# Patient Record
Sex: Male | Born: 1990 | Race: White | Hispanic: No | Marital: Single | Smoking: Current every day smoker
Health system: Southern US, Community
[De-identification: ages and names within clinical notes are randomized; demographics above are authoritative.]

## PROBLEM LIST (undated history)

## (undated) ENCOUNTER — Emergency Department (HOSPITAL_COMMUNITY): Admission: EM | Payer: Self-pay | Source: Home / Self Care

## (undated) DIAGNOSIS — J45909 Unspecified asthma, uncomplicated: Secondary | ICD-10-CM

## (undated) DIAGNOSIS — F1910 Other psychoactive substance abuse, uncomplicated: Secondary | ICD-10-CM

## (undated) HISTORY — PX: APPENDECTOMY: SHX54

---
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
## 2013-08-25 ENCOUNTER — Encounter (HOSPITAL_COMMUNITY): Payer: Self-pay | Admitting: Emergency Medicine

## 2013-08-25 ENCOUNTER — Emergency Department (HOSPITAL_COMMUNITY): Payer: Self-pay

## 2013-08-25 ENCOUNTER — Emergency Department (HOSPITAL_COMMUNITY)
Admission: EM | Admit: 2013-08-25 | Discharge: 2013-08-25 | Disposition: A | Discharge status: Home / Self Care | Payer: Self-pay | Attending: Emergency Medicine | Admitting: Emergency Medicine

## 2013-08-25 DIAGNOSIS — F17200 Nicotine dependence, unspecified, uncomplicated: Secondary | ICD-10-CM | POA: Insufficient documentation

## 2013-08-25 DIAGNOSIS — X500XXA Overexertion from strenuous movement or load, initial encounter: Secondary | ICD-10-CM | POA: Insufficient documentation

## 2013-08-25 DIAGNOSIS — S93609A Unspecified sprain of unspecified foot, initial encounter: Secondary | ICD-10-CM | POA: Insufficient documentation

## 2013-08-25 DIAGNOSIS — Y9239 Other specified sports and athletic area as the place of occurrence of the external cause: Secondary | ICD-10-CM | POA: Insufficient documentation

## 2013-08-25 DIAGNOSIS — Y9351 Activity, roller skating (inline) and skateboarding: Secondary | ICD-10-CM | POA: Insufficient documentation

## 2013-08-25 DIAGNOSIS — S93601A Unspecified sprain of right foot, initial encounter: Secondary | ICD-10-CM

## 2013-08-25 MED ORDER — HYDROCODONE-ACETAMINOPHEN 5-325 MG PO TABS: ORAL_TABLET | ORAL | Status: DC

## 2013-08-25 MED ORDER — IBUPROFEN 800 MG PO TABS
800.0000 mg | ORAL_TABLET | Freq: Once | ORAL | Status: AC
  Administered 2013-08-25: 800 mg via ORAL
  Filled 2013-08-25: qty 1

## 2013-08-25 MED ORDER — HYDROCODONE-ACETAMINOPHEN 5-325 MG PO TABS
1.0000 | ORAL_TABLET | Freq: Once | ORAL | Status: AC
  Administered 2013-08-25: 1 via ORAL
  Filled 2013-08-25: qty 1

## 2013-08-25 MED ORDER — IBUPROFEN 800 MG PO TABS: 800.0000 mg | ORAL_TABLET | Freq: Three times a day (TID) | ORAL | Status: DC

## 2013-08-25 NOTE — ED Notes (Signed)
Pt injured right foot yesterday while skateboarding.  Came into triage using crutches.

## 2013-08-25 NOTE — ED Provider Notes (Signed)
CSN: [id number]     Arrival date & time 08/25/13  1539 History   First MD Initiated Contact with Patient 08/25/13 1704     Chief Complaint  Patient presents with  . Foot Pain   (Consider location/radiation/quality/duration/timing/severity/associated sxs/prior Treatment) HPI Comments: Taylor Carpenter is a 22 y.o. male who presents to the Emergency Department complaining of right foot pain for one day.  States the pain began after he twisted his foot while skateboarding.  Pain is worse with weight bearing and improves somewhat with rest.  He denies laceration, numbness or weakness.  No hx of previous fx's of the ankle or foot.  He has not tried any therapies prior to arrival   The history is provided by the patient.    History reviewed. No pertinent past medical history. Past Surgical History  Procedure Laterality Date  . Appendectomy     No family history on file. History  Substance Use Topics  . Smoking status: Current Every Day Smoker    Types: Cigarettes  . Smokeless tobacco: Not on file  . Alcohol Use: Yes    Review of Systems  Constitutional: Negative for fever and chills.  Genitourinary: Negative for dysuria and difficulty urinating.  Musculoskeletal: Positive for arthralgias and joint swelling.  Skin: Negative for color change, rash and wound.  Neurological: Negative for weakness and numbness.  All other systems reviewed and are negative.    Allergies  Amoxil  Home Medications  No current outpatient prescriptions on file. BP 126/64  Pulse 93  Temp(Src) 98.1 F (36.7 C) (Oral)  Resp 24  Ht 5\' 8"  (1.727 m)  Wt 130 lb (58.968 kg)  BMI 19.77 kg/m2  SpO2 100% Physical Exam  Nursing note and vitals reviewed. Constitutional: He is oriented to person, place, and time. He appears well-developed and well-nourished. No distress.  HENT:  Head: Normocephalic and atraumatic.  Cardiovascular: Normal rate, regular rhythm, normal heart sounds and intact distal pulses.    No murmur heard. Pulmonary/Chest: Effort normal and breath sounds normal. No respiratory distress.  Musculoskeletal: He exhibits tenderness.  Right lateral ankle is ttp, mild STS is present.  ROM is preserved.  DP pulse is brisk,distal sensation intact.  No erythema, abrasion, bruising or bony deformity.  No proximal tenderness.  Neurological: He is alert and oriented to person, place, and time. He exhibits normal muscle tone. Coordination normal.  Skin: Skin is warm and dry.    ED Course  Procedures (including critical care time) Labs Review Labs Reviewed - No data to display Imaging Review Dg Ankle Complete Right  08/25/2013   CLINICAL DATA:  Pain in the region of the metatarsals after skateboarding injury/trauma yesterday  EXAM: RIGHT ANKLE - COMPLETE 3+ VIEW  COMPARISON:  Foot radiographs obtained and dictated separately  FINDINGS: There is no evidence of fracture, dislocation, or joint effusion. There is no evidence of arthropathy or other focal bone abnormality. Soft tissues are unremarkable. The mortise is symmetric.  IMPRESSION: Negative.   Electronically Signed   By: Christiana Pellant M.D.   On: 08/25/2013 16:27   Dg Foot Complete Right  08/25/2013   CLINICAL DATA:  Pain in the metatarsals after skateboarding  EXAM: RIGHT FOOT COMPLETE - 3+ VIEW  COMPARISON:  Ankle radiographs same date, dictated separately  FINDINGS: There is no evidence of fracture or dislocation. There is no evidence of arthropathy or other focal bone abnormality. Soft tissues are unremarkable.  IMPRESSION: Negative.   Electronically Signed   By: Christiana Pellant  M.D.   On: 08/25/2013 16:28    EKG Interpretation   None       MDM    Patient has own crutches.  Agrees to RICE therapy and orthopedic f/u if needed.  ASO splint applied.  Pain improved.  Remains NV intact.  Patient appears stable for discharge.  Chaylee Ehrsam L. Shandiin Eisenbeis, PA-C 08/29/13 0130

## 2013-08-29 NOTE — ED Provider Notes (Signed)
Medical screening examination/treatment/procedure(s) were performed by non-physician practitioner and as supervising physician I was immediately available for consultation/collaboration.  EKG Interpretation   None         Jerney Baksh L Ashawna Hanback, MD 08/29/13 0925 

## 2017-04-29 ENCOUNTER — Encounter (HOSPITAL_COMMUNITY): Payer: Self-pay

## 2017-04-29 ENCOUNTER — Emergency Department (HOSPITAL_COMMUNITY)
Admission: EM | Admit: 2017-04-29 | Discharge: 2017-04-29 | Disposition: A | Discharge status: Home Health | Payer: Self-pay | Attending: Emergency Medicine | Admitting: Emergency Medicine

## 2017-04-29 DIAGNOSIS — T50901A Poisoning by unspecified drugs, medicaments and biological substances, accidental (unintentional), initial encounter: Secondary | ICD-10-CM | POA: Insufficient documentation

## 2017-04-29 DIAGNOSIS — J45909 Unspecified asthma, uncomplicated: Secondary | ICD-10-CM | POA: Insufficient documentation

## 2017-04-29 DIAGNOSIS — Z79899 Other long term (current) drug therapy: Secondary | ICD-10-CM | POA: Insufficient documentation

## 2017-04-29 DIAGNOSIS — F1910 Other psychoactive substance abuse, uncomplicated: Secondary | ICD-10-CM | POA: Insufficient documentation

## 2017-04-29 DIAGNOSIS — F17290 Nicotine dependence, other tobacco product, uncomplicated: Secondary | ICD-10-CM | POA: Insufficient documentation

## 2017-04-29 HISTORY — DX: Other psychoactive substance abuse, uncomplicated: F19.10

## 2017-04-29 HISTORY — DX: Unspecified asthma, uncomplicated: J45.909

## 2017-04-29 LAB — CBC
HCT: 41.6 % (ref 39.0–52.0)
HEMOGLOBIN: 14.2 g/dL (ref 13.0–17.0)
MCH: 29.8 pg (ref 26.0–34.0)
MCHC: 34.1 g/dL (ref 30.0–36.0)
MCV: 87.4 fL (ref 78.0–100.0)
Platelets: 260 10*3/uL (ref 150–400)
RBC: 4.76 MIL/uL (ref 4.22–5.81)
RDW: 12.7 % (ref 11.5–15.5)
WBC: 13.7 10*3/uL — ABNORMAL HIGH (ref 4.0–10.5)

## 2017-04-29 LAB — BASIC METABOLIC PANEL
ANION GAP: 7 (ref 5–15)
BUN: 10 mg/dL (ref 6–20)
CO2: 25 mmol/L (ref 22–32)
Calcium: 8.8 mg/dL — ABNORMAL LOW (ref 8.9–10.3)
Chloride: 105 mmol/L (ref 101–111)
Creatinine, Ser: 1.02 mg/dL (ref 0.61–1.24)
GFR calc non Af Amer: >60 mL/min (ref >60)
Glucose, Bld: 202 mg/dL — ABNORMAL HIGH (ref 65–99)
POTASSIUM: 3.9 mmol/L (ref 3.5–5.1)
Sodium: 137 mmol/L (ref 135–145)

## 2017-04-29 MED ORDER — SODIUM CHLORIDE 0.9 % IV BOLUS (SEPSIS)
1000.0000 mL | Freq: Once | INTRAVENOUS | Status: AC
  Administered 2017-04-29: 1000 mL via INTRAVENOUS

## 2017-04-29 NOTE — ED Provider Notes (Signed)
[facility]PT Provider Note   CSN: [id number] Arrival date & time: 04/29/17  1426    History   Chief Complaint Chief Complaint  Patient presents with  . Drug Overdose    HPI Taylor PaganiniWilliam J Genco is a 26 y.o. male.  HPI Patient presented to the emergency room after an accidental drug overdose.  Patient states he did use some cocaine earlier in the day however he was snorting heroin this afternoon. patient was found by bystanders , unconscious with sonorous respirations.  EMS was contacted. Patient was given intranasal Narcan.  Shortly thereafter the patient was awake and alert. Patient denies any complaints. He was not trying to harm himself. He was just trying to get high. Patient denies any trouble with chest pain or shortness of breath. No other difficulties. Past Medical History:  Diagnosis Date  . Asthma   . Polysubstance abuse    heroin and opiate addict. Pt reports starting drugs at 14 and IV drugs 26    There are no active problems to display for this patient.   Past Surgical History:  Procedure Laterality Date  . APPENDECTOMY         Home Medications    Prior to Admission medications   Medication Sig Start Date End Date Taking? Authorizing Provider  HYDROcodone-acetaminophen (NORCO/VICODIN) 5-325 MG per tablet Take one-two tabs po q 4-6 hrs prn pain 08/25/13   Triplett, Tammy, PA-C  ibuprofen (ADVIL,MOTRIN) 800 MG tablet Take 1 tablet (800 mg total) by mouth 3 (three) times daily. 08/25/13   Pauline Ausriplett, Tammy, PA-C    Family History No family history on file.  Social History Social History  Substance Use Topics  . Smoking status: Current Every Day Smoker    Packs/day: 1.00    Types: Cigarettes  . Smokeless tobacco: Not on file  . Alcohol use Yes     Allergies   Amoxil [amoxicillin]   Review of Systems Review of Systems  All other systems reviewed and are negative.    Physical Exam Updated Vital Signs BP (!) 121/97   Pulse 99   Temp 98.5  F (36.9 C) (Oral)   Resp (!) 21   SpO2 97%   Physical Exam  Constitutional: He appears well-developed and well-nourished. No distress.  HENT:  Head: Normocephalic and atraumatic.  Right Ear: External ear normal.  Left Ear: External ear normal.  Eyes: Conjunctivae are normal. Right eye exhibits no discharge. Left eye exhibits no discharge. No scleral icterus.  Neck: Neck supple. No tracheal deviation present.  Cardiovascular: Regular rhythm and intact distal pulses.  Tachycardia present.   Pulmonary/Chest: Effort normal and breath sounds normal. No stridor. No respiratory distress. He has no wheezes. He has no rales.  Abdominal: Soft. Bowel sounds are normal. He exhibits no distension. There is no tenderness. There is no rebound and no guarding.  Musculoskeletal: He exhibits no edema or tenderness.  Neurological: He is alert. He has normal strength. No cranial nerve deficit (no facial droop, extraocular movements intact, no slurred speech) or sensory deficit. He exhibits normal muscle tone. He displays no seizure activity. Coordination normal.  Skin: Skin is warm and dry. No rash noted.  Psychiatric: He has a normal mood and affect.  Nursing note and vitals reviewed.    ED Treatments / Results  Labs (all labs ordered are listed, but only abnormal results are displayed) Labs Reviewed  CBC - Abnormal; Notable for the following:       Result Value   WBC 13.7 (*)  All other components within normal limits  BASIC METABOLIC PANEL - Abnormal; Notable for the following:    Glucose, Bld 202 (*)    Calcium 8.8 (*)    All other components within normal limits    EKG  EKG Interpretation  Date/Time:  Saturday April 29 2017 14:26:18 EDT Ventricular Rate:  113 PR Interval:    QRS Duration: 92 QT Interval:  345 QTC Calculation: 473 R Axis:   137 Text Interpretation:  Sinus tachycardia Probable right ventricular hypertrophy Borderline prolonged QT interval No old tracing to  compare Confirmed by Linwood Dibbles [phone]) on 04/29/2017 2:41:12 PM       Radiology No results found.  Procedures Procedures (including critical care time)  Medications Ordered in ED Medications  sodium chloride 0.9 % bolus 1,000 mL (1,000 mLs Intravenous New Bag/Given 04/29/17 1506)     Initial Impression / Assessment and Plan / ED Course  I have reviewed the triage vital signs and the nursing notes.  Pertinent labs & imaging results that were available during my care of the patient were reviewed by me and considered in my medical decision making (see chart for details).  Clinical Course as of Apr 29 1537  Sat Apr 29, 2017  1440 I spoke with the patient, recommending that he tried to get help with his substance abuse problem. We discussed the severity of this overdose and without getting Narcan it's possible he could've died.  Stressed importance of him stopping his drug use.    [JK]  1536 Pt remains alert and awake.  He remains asymptomatic.  He feels ready to go.  [JK]    Clinical Course User Index [JK] Linwood Dibbles, MD   Patient presented to the emergency room after an accidental drug overdose. Patient was monitored in the emergency room. He remained symptom-free.  We discussed the importance of not using any drugs. Out Patient resources were given to help him with his substance abuse problems  Final Clinical Impressions(s) / ED Diagnoses   Final diagnoses:  Accidental drug overdose, initial encounter    New Prescriptions New Prescriptions   No medications on file     Linwood Dibbles, MD 04/29/17 1538

## 2017-04-29 NOTE — ED Notes (Signed)
Authorities in room speaking with pt

## 2017-04-29 NOTE — ED Triage Notes (Addendum)
Pt found by bystander unconscious with snoring resp . EMs called. Administered narcan intranasal and pt came around. Pt reports injecting heroin. Pt is alert and verbally responsive at this time

## 2017-04-29 NOTE — Discharge Instructions (Signed)
Please refer to the discharge instructions for resources in the community to help you with your substance abuse problems as we discussed.  Avoid using drugs in the future.

## 2017-06-03 ENCOUNTER — Emergency Department (HOSPITAL_COMMUNITY)
Admission: EM | Admit: 2017-06-03 | Discharge: 2017-06-04 | Disposition: A | Discharge status: Home / Self Care | Payer: Self-pay | Attending: Emergency Medicine | Admitting: Emergency Medicine

## 2017-06-03 ENCOUNTER — Encounter (HOSPITAL_COMMUNITY): Payer: Self-pay | Admitting: *Deleted

## 2017-06-03 DIAGNOSIS — Z79899 Other long term (current) drug therapy: Secondary | ICD-10-CM | POA: Insufficient documentation

## 2017-06-03 DIAGNOSIS — F1910 Other psychoactive substance abuse, uncomplicated: Secondary | ICD-10-CM | POA: Insufficient documentation

## 2017-06-03 DIAGNOSIS — F17210 Nicotine dependence, cigarettes, uncomplicated: Secondary | ICD-10-CM | POA: Insufficient documentation

## 2017-06-03 DIAGNOSIS — F1110 Opioid abuse, uncomplicated: Secondary | ICD-10-CM | POA: Insufficient documentation

## 2017-06-03 LAB — CBC WITH DIFFERENTIAL/PLATELET
BASOS PCT: 0 %
Basophils Absolute: 0.1 10*3/uL (ref 0.0–0.1)
EOS ABS: 0.3 10*3/uL (ref 0.0–0.7)
Eosinophils Relative: 3 %
HCT: 39.4 % (ref 39.0–52.0)
HEMOGLOBIN: 13.5 g/dL (ref 13.0–17.0)
Lymphocytes Relative: 23 %
Lymphs Abs: 2.5 10*3/uL (ref 0.7–4.0)
MCH: 29.9 pg (ref 26.0–34.0)
MCHC: 34.3 g/dL (ref 30.0–36.0)
MCV: 87.2 fL (ref 78.0–100.0)
MONOS PCT: 10 %
Monocytes Absolute: 1.1 10*3/uL — ABNORMAL HIGH (ref 0.1–1.0)
Neutro Abs: 7.3 10*3/uL (ref 1.7–7.7)
Neutrophils Relative %: 64 %
Platelets: 242 10*3/uL (ref 150–400)
RBC: 4.52 MIL/uL (ref 4.22–5.81)
RDW: 12.9 % (ref 11.5–15.5)
WBC: 11.3 10*3/uL — ABNORMAL HIGH (ref 4.0–10.5)

## 2017-06-03 LAB — URINALYSIS, ROUTINE W REFLEX MICROSCOPIC
BILIRUBIN URINE: NEGATIVE
Glucose, UA: NEGATIVE mg/dL
HGB URINE DIPSTICK: NEGATIVE
Ketones, ur: NEGATIVE mg/dL
Leukocytes, UA: NEGATIVE
NITRITE: NEGATIVE
PROTEIN: NEGATIVE mg/dL
SPECIFIC GRAVITY, URINE: 1.01 (ref 1.005–1.030)
pH: 5 (ref 5.0–8.0)

## 2017-06-03 LAB — COMPREHENSIVE METABOLIC PANEL
ALBUMIN: 3.8 g/dL (ref 3.5–5.0)
ALT: 13 U/L — AB (ref 17–63)
AST: 19 U/L (ref 15–41)
Alkaline Phosphatase: 89 U/L (ref 38–126)
Anion gap: 10 (ref 5–15)
BUN: 6 mg/dL (ref 6–20)
CHLORIDE: 101 mmol/L (ref 101–111)
CO2: 27 mmol/L (ref 22–32)
Calcium: 8.9 mg/dL (ref 8.9–10.3)
Creatinine, Ser: 0.9 mg/dL (ref 0.61–1.24)
GFR calc Af Amer: >60 mL/min (ref >60)
GFR calc non Af Amer: >60 mL/min (ref >60)
GLUCOSE: 73 mg/dL (ref 65–99)
POTASSIUM: 3.3 mmol/L — AB (ref 3.5–5.1)
Sodium: 138 mmol/L (ref 135–145)
Total Bilirubin: 0.4 mg/dL (ref 0.3–1.2)
Total Protein: 7 g/dL (ref 6.5–8.1)

## 2017-06-03 LAB — RAPID URINE DRUG SCREEN, HOSP PERFORMED
AMPHETAMINES: NOT DETECTED
BENZODIAZEPINES: POSITIVE — AB
Barbiturates: NOT DETECTED
Cocaine: POSITIVE — AB
Opiates: POSITIVE — AB
TETRAHYDROCANNABINOL: POSITIVE — AB

## 2017-06-03 LAB — ETHANOL: Alcohol, Ethyl (B): <10 mg/dL (ref <10)

## 2017-06-03 LAB — ACETAMINOPHEN LEVEL

## 2017-06-03 LAB — SALICYLATE LEVEL: Salicylate Lvl: <7 mg/dL (ref 2.8–30.0)

## 2017-06-03 NOTE — ED Notes (Signed)
Front desk called asking if the pt mother and sister could come back. Pt stated that he did not want to see them or have any visitors.  Pt  Mother called and asked to speak to the police officer after further talking to the mother she was tring to get information regarding the pt, advised the mother that the officer was only sitting/watching and could not give any information.

## 2017-06-03 NOTE — ED Notes (Signed)
Pt changed into scrubs, pts belongings locked in locker, pt states he does not need to urinate at this time, aware of DO.

## 2017-06-03 NOTE — ED Triage Notes (Signed)
Pt brought in by [employer] with IVC papers that were taken out by his mother. Pt and mother report a heroin overdose 9-2. Pt reports he got "bad" heroin. Mother reports finding syringes in the home and is scared the pt is going to overdose again and is not following through with his treatment with [facility]. Pt reports abusing marijuana, cocaine, roxies and  Percocet. Pt denies use of heroin since his overdose. Pt denies si/hi and feels hopeless.

## 2017-06-03 NOTE — ED Provider Notes (Signed)
[facility] Provider Note   CSN: [id number] Arrival date & time: 06/03/17  1850     History   Chief Complaint Chief Complaint  Patient presents with  . V70.1    HPI Taylor Carpenter is a 26 y.o. male.  The patient was committed by his mother for substance abuse. He does have a history of an accidental overdose with heroin about 1 month ago.he has not been getting patient states that he is now beginning an outpatient help.   The history is provided by the patient.  Altered Mental Status   This is a recurrent problem. The current episode started more than 1 week ago. The problem has not changed since onset.Associated symptoms include agitation. Pertinent negatives include no seizures and no hallucinations. Risk factors include illicit drug use. His past medical history does not include seizures.    Past Medical History:  Diagnosis Date  . Asthma   . 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 Pt reports starting drugs at 14 and IV drugs 26    There are no active problems to display for this patient.   Past Surgical History:  Procedure Laterality Date  . APPENDECTOMY         Home Medications    Prior to Admission medications   Medication Sig Start Date End Date Taking? Authorizing Provider  HYDROcodone-acetaminophen (NORCO/VICODIN) 5-325 MG per tablet Take one-two tabs po q 4-6 hrs prn pain 08/25/13   Triplett, Tammy, PA-C  ibuprofen (ADVIL,MOTRIN) 800 MG tablet Take 1 tablet (800 mg total) by mouth 3 (three) times daily. 08/25/13   Pauline Aus, PA-C    Family History History reviewed. No pertinent family history.  Social History Social History  Substance Use Topics  . Smoking status: Current Every Day Smoker    Packs/day: 1.00    Types: Cigarettes  . Smokeless tobacco: Never Used  . Alcohol use Yes     Allergies   Amoxil [amoxicillin]   Review of Systems Review of Systems  Constitutional: Negative for appetite change and  fatigue.  HENT: Negative for congestion, ear discharge and sinus pressure.   Eyes: Negative for discharge.  Respiratory: Negative for cough.   Cardiovascular: Negative for chest pain.  Gastrointestinal: Negative for abdominal pain and diarrhea.  Genitourinary: Negative for frequency and hematuria.  Musculoskeletal: Negative for back pain.  Skin: Negative for rash.  Neurological: Negative for seizures and headaches.  Psychiatric/Behavioral: Positive for agitation. Negative for hallucinations.     Physical Exam Updated Vital Signs BP (!) 146/97 (BP Location: Right Arm)   Pulse 86   Temp 98.4 F (36.9 C) (Oral)   Resp 18   Ht  (1.727 m)   Wt 54.4 kg (120 lb)   SpO2 100%   BMI 18.25 kg/m   Physical Exam  Constitutional: He is oriented to person, place, and time. He appears well-developed.  HENT:  Head: Normocephalic.  Eyes: Conjunctivae and EOM are normal. No scleral icterus.  Neck: Neck supple. No thyromegaly present.  Cardiovascular: Normal rate and regular rhythm.  Exam reveals no gallop and no friction rub.   No murmur heard. Pulmonary/Chest: No stridor. He has no wheezes. He has no rales. He exhibits no tenderness.  Abdominal: He exhibits no distension. There is no tenderness. There is no rebound.  Musculoskeletal: Normal range of motion. He exhibits no edema.  Lymphadenopathy:    He has no cervical adenopathy.  Neurological: He is oriented to person, place, and time. He exhibits  normal muscle tone. Coordination normal.  Skin: No rash noted. No erythema.  Psychiatric:  Patient has some depression. but is not suicidal or homicidal     ED Treatments / Results  Labs (all labs ordered are listed, but only abnormal results are displayed) Labs Reviewed  COMPREHENSIVE METABOLIC PANEL - Abnormal; Notable for the following:       Result Value   Potassium 3.3 (*)    ALT 13 (*)    All other components within normal limits  RAPID URINE DRUG SCREEN, HOSP PERFORMED -  Abnormal; Notable for the following:    Opiates POSITIVE (*)    Cocaine POSITIVE (*)    Benzodiazepines POSITIVE (*)    Tetrahydrocannabinol POSITIVE (*)    All other components within normal limits  CBC WITH DIFFERENTIAL/PLATELET - Abnormal; Notable for the following:    WBC 11.3 (*)    Monocytes Absolute 1.1 (*)    All other components within normal limits  ACETAMINOPHEN LEVEL - Abnormal; Notable for the following:    Acetaminophen (Tylenol), Serum <10 (*)    All other components within normal limits  ETHANOL  URINALYSIS, ROUTINE W REFLEX MICROSCOPIC  SALICYLATE LEVEL    EKG  EKG Interpretation None       Radiology No results found.  Procedures Procedures (including critical care time)  Medications Ordered in ED Medications - No data to display   Initial Impression / Assessment and Plan / ED Course  I have reviewed the triage vital signs and the nursing notes.  Pertinent labs & imaging results that were available during my care of the patient were reviewed by me and considered in my medical decision making (see chart for details).     Patient was substance abuse. He will be evaluated by behavioral health  Final Clinical Impressions(s) / ED Diagnoses   Final diagnoses:  Substance abuse [facility])    New Prescriptions New Prescriptions   No medications on file     Bethann Berkshire, MD 06/03/17 2222
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
## 2017-06-04 NOTE — Discharge Instructions (Signed)
Follow up with [facility] as discussed tonight with the mental health worker. Return to the ED if you think you are going to purposely harm yourself or someone else.

## 2017-06-04 NOTE — ED Provider Notes (Signed)
Ford, TTS, called at 12:30 AM. He states he has done a tele-psych evaluation the patient and does not feel patient meets criteria for inpatient admission. He notes that Dr. Estell Harpin has already rescinded the IVC. He states the PA is going to evaluate the patient.  12:50 AM 4, TTS called back stating the PA has evaluated the patient. He agrees the patient can be discharged home to [facility].  1:10 AM patient is watching TV in his room in no distress. I talked to him and he is agreeable to being discharged and follow-up that [facility]. He states he's never gone to a regular program before. Patient does not have SI or HI.  Diagnoses that have been ruled out:  None  Diagnoses that are still under consideration:  None  Final diagnoses:  Substance abuse (HCC)  Heroin abuse [facility])    Plan discharge  Devoria Albe, MD, Concha Pyo, MD 06/04/17 [phone]

## 2017-06-04 NOTE — Progress Notes (Signed)
Patient was IVCd by family due to substance abuse. IVC was rescinded by Dr. Estell Harpin prior to evaluation by TTS or this Clinical research associate. Patient examined via telepsych. On exam he is alert and oriented x 4, pleasant, and cooperative. Appears well groomed.  Mood appears euthymic. Affect appropriate and congruent with mood. Denies suicidal thoughts, homicidal thoughts, audiovisual hallucinations. No indication that he is responding to internal stimuli. He does report polysubstance abuse. Uses opiates, xanax, marijuana, and cocaine. Patient does not meet inpatient criteria. TTS to provide with resources for substance abuse treatment. Case discussed with Dr. Lucianne Muss.

## 2017-06-04 NOTE — ED Notes (Signed)
Pt in room getting dressed no signs of aggression.[facility]

## 2017-09-07 ENCOUNTER — Emergency Department (HOSPITAL_COMMUNITY): Payer: No Typology Code available for payment source

## 2017-09-07 ENCOUNTER — Encounter (HOSPITAL_COMMUNITY): Payer: Self-pay | Admitting: Adult Health

## 2017-09-07 ENCOUNTER — Other Ambulatory Visit: Payer: Self-pay

## 2017-09-07 ENCOUNTER — Emergency Department (HOSPITAL_COMMUNITY)
Admission: EM | Admit: 2017-09-07 | Discharge: 2017-09-07 | Disposition: A | Discharge status: Home / Self Care | Payer: No Typology Code available for payment source | Attending: Emergency Medicine | Admitting: Emergency Medicine

## 2017-09-07 DIAGNOSIS — S50811A Abrasion of right forearm, initial encounter: Secondary | ICD-10-CM | POA: Diagnosis not present

## 2017-09-07 DIAGNOSIS — Y939 Activity, unspecified: Secondary | ICD-10-CM | POA: Diagnosis not present

## 2017-09-07 DIAGNOSIS — J45909 Unspecified asthma, uncomplicated: Secondary | ICD-10-CM | POA: Diagnosis not present

## 2017-09-07 DIAGNOSIS — Z23 Encounter for immunization: Secondary | ICD-10-CM | POA: Diagnosis not present

## 2017-09-07 DIAGNOSIS — S0990XA Unspecified injury of head, initial encounter: Secondary | ICD-10-CM | POA: Insufficient documentation

## 2017-09-07 DIAGNOSIS — Y929 Unspecified place or not applicable: Secondary | ICD-10-CM | POA: Diagnosis not present

## 2017-09-07 DIAGNOSIS — Z880 Allergy status to penicillin: Secondary | ICD-10-CM | POA: Insufficient documentation

## 2017-09-07 DIAGNOSIS — F17210 Nicotine dependence, cigarettes, uncomplicated: Secondary | ICD-10-CM | POA: Diagnosis not present

## 2017-09-07 DIAGNOSIS — Y999 Unspecified external cause status: Secondary | ICD-10-CM | POA: Insufficient documentation

## 2017-09-07 MED ORDER — IBUPROFEN 600 MG PO TABS: 600.0000 mg | ORAL_TABLET | Freq: Four times a day (QID) | ORAL | 0 refills | Status: DC | PRN

## 2017-09-07 MED ORDER — TETANUS-DIPHTH-ACELL PERTUSSIS 5-2.5-18.5 LF-MCG/0.5 IM SUSP
0.5000 mL | Freq: Once | INTRAMUSCULAR | Status: AC
  Administered 2017-09-07: 0.5 mL via INTRAMUSCULAR

## 2017-09-07 MED ORDER — TETANUS-DIPHTH-ACELL PERTUSSIS 5-2.5-18.5 LF-MCG/0.5 IM SUSP
INTRAMUSCULAR | Status: AC
  Administered 2017-09-07: 23:00:00 0.5 mL via INTRAMUSCULAR
  Filled 2017-09-07: qty 0.5

## 2017-09-07 MED ORDER — ACETAMINOPHEN 325 MG PO TABS
650.0000 mg | ORAL_TABLET | Freq: Once | ORAL | Status: AC
  Administered 2017-09-07: 650 mg via ORAL
  Filled 2017-09-07: qty 2

## 2017-09-07 MED ORDER — BACITRACIN-NEOMYCIN-POLYMYXIN 400-5-5000 EX OINT
TOPICAL_OINTMENT | Freq: Once | CUTANEOUS | Status: AC
  Administered 2017-09-07: 1 via TOPICAL
  Filled 2017-09-07: qty 1
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
## 2018-04-14 ENCOUNTER — Other Ambulatory Visit: Payer: Self-pay

## 2018-04-14 ENCOUNTER — Emergency Department (HOSPITAL_COMMUNITY)
Admission: EM | Admit: 2018-04-14 | Discharge: 2018-04-14 | Disposition: A | Discharge status: Home / Self Care | Payer: Self-pay | Attending: Emergency Medicine | Admitting: Emergency Medicine

## 2018-04-14 ENCOUNTER — Encounter (HOSPITAL_COMMUNITY): Payer: Self-pay

## 2018-04-14 ENCOUNTER — Emergency Department (HOSPITAL_COMMUNITY): Payer: Self-pay

## 2018-04-14 DIAGNOSIS — L03113 Cellulitis of right upper limb: Secondary | ICD-10-CM | POA: Insufficient documentation

## 2018-04-14 DIAGNOSIS — F17210 Nicotine dependence, cigarettes, uncomplicated: Secondary | ICD-10-CM | POA: Insufficient documentation

## 2018-04-14 DIAGNOSIS — L02419 Cutaneous abscess of limb, unspecified: Secondary | ICD-10-CM

## 2018-04-14 DIAGNOSIS — L02413 Cutaneous abscess of right upper limb: Secondary | ICD-10-CM | POA: Insufficient documentation

## 2018-04-14 DIAGNOSIS — L03119 Cellulitis of unspecified part of limb: Secondary | ICD-10-CM

## 2018-04-14 MED ORDER — POVIDONE-IODINE 10 % EX SOLN
CUTANEOUS | Status: AC
  Administered 2018-04-14: 3
  Filled 2018-04-14: qty 45

## 2018-04-14 MED ORDER — POVIDONE-IODINE 10 % OINT PACKET: TOPICAL_OINTMENT | Freq: Once | CUTANEOUS | Status: DC

## 2018-04-14 MED ORDER — CLINDAMYCIN HCL 150 MG PO CAPS
300.0000 mg | ORAL_CAPSULE | Freq: Once | ORAL | Status: AC
  Administered 2018-04-14: 300 mg via ORAL
  Filled 2018-04-14: qty 2

## 2018-04-14 MED ORDER — LIDOCAINE-EPINEPHRINE (PF) 2 %-1:200000 IJ SOLN
20.0000 mL | Freq: Once | INTRAMUSCULAR | Status: AC
  Administered 2018-04-14: 20 mL

## 2018-04-14 MED ORDER — CLINDAMYCIN HCL 300 MG PO CAPS: 300.0000 mg | ORAL_CAPSULE | Freq: Three times a day (TID) | ORAL | 0 refills | Status: DC

## 2018-04-14 MED ORDER — POVIDONE-IODINE 10 % EX OINT
TOPICAL_OINTMENT | Freq: Once | CUTANEOUS | Status: AC
  Administered 2018-04-14: 3 via TOPICAL
  Filled 2018-04-14: qty 28.35

## 2018-04-14 MED ORDER — BACITRACIN ZINC 500 UNIT/GM EX OINT: 1.0000 "application " | TOPICAL_OINTMENT | Freq: Two times a day (BID) | CUTANEOUS | 0 refills | Status: DC

## 2018-04-14 MED ORDER — LIDOCAINE-EPINEPHRINE (PF) 2 %-1:200000 IJ SOLN
20.0000 mL | Freq: Once | INTRAMUSCULAR | Status: DC
  Filled 2018-04-14: qty 20

## 2018-04-14 NOTE — ED Notes (Signed)
Pt prefers to be referred to by "Joey"

## 2018-04-14 NOTE — ED Provider Notes (Signed)
[facility] EMERGENCY DEPARTMENT Provider Note   CSN: [id number] Arrival date & time: 04/14/18  0228     History   Chief Complaint Chief Complaint  Patient presents with  . Wound Check    HPI Taylor Carpenter is a 27 y.o. male.  Patient presenting with multiple wounds to his left forearm and face.  States this is from picking.  He admits to abusing methamphetamine and injecting about 1 week ago.  He states he has had wounds from picking and itching and not from injection use.  Denies fever, chills, nausea or vomiting.  Denies focal weakness, numbness or tingling.  He is been trying to open some of the wounds on his own and some of them have been draining liquid and blood and pus. He is up-to-date on his tetanus shot.  The history is provided by the patient.  Wound Check  Pertinent negatives include no chest pain, no abdominal pain, no headaches and no shortness of breath.    Past Medical History:  Diagnosis Date  . Asthma   . 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 Pt reports starting drugs at 14 and IV drugs 26    There are no active problems to display for this patient.   Past Surgical History:  Procedure Laterality Date  . APPENDECTOMY          Home Medications    Prior to Admission medications   Medication Sig Start Date End Date Taking? Authorizing Provider  HYDROcodone-acetaminophen (NORCO/VICODIN) 5-325 MG per tablet Take one-two tabs po q 4-6 hrs prn pain 08/25/13   Triplett, Tammy, PA-C  ibuprofen (ADVIL,MOTRIN) 600 MG tablet Take 1 tablet (600 mg total) by mouth every 6 (six) hours as needed for headache or moderate pain. 09/07/17   Burgess Amor, PA-C    Family History No family history on file.  Social History Social History   Tobacco Use  . Smoking status: Current Every Day Smoker    Packs/day: 1.00    Types: Cigarettes  . Smokeless tobacco: Never Used  Substance Use Topics  . Alcohol use: Yes  . Drug use: Yes    Types:  Marijuana, Cocaine, IV    Comment: heroine addict -denies any since 9-2      Allergies   Amoxil [amoxicillin]   Review of Systems Review of Systems  Constitutional: Negative for activity change, appetite change and fever.  HENT: Negative for congestion and rhinorrhea.   Eyes: Negative for visual disturbance.  Respiratory: Negative for cough, chest tightness and shortness of breath.   Cardiovascular: Negative for chest pain.  Gastrointestinal: Negative for abdominal pain, nausea and vomiting.  Genitourinary: Negative for dysuria, hematuria and testicular pain.  Musculoskeletal: Negative for arthralgias and myalgias.  Skin: Positive for wound.  Neurological: Negative for dizziness, weakness and headaches.   all other systems are negative except as noted in the HPI and PMH.     Physical Exam Updated Vital Signs BP 122/87 (BP Location: Right Arm)   Pulse (!) 106   Temp 98 F (36.7 C) (Oral)   Resp 16   Ht 5\' 8"  (1.727 m)   Wt 58.1 kg   SpO2 97%   BMI 19.46 kg/m   Physical Exam  Constitutional: He is oriented to person, place, and time. He appears well-developed and well-nourished. No distress.  HENT:  Head: Normocephalic and atraumatic.  Mouth/Throat: Oropharynx is clear and moist. No oropharyngeal exudate.  Eyes: Pupils are equal, round, and reactive to light.  Conjunctivae and EOM are normal.  Neck: Normal range of motion. Neck supple.  No meningismus.  Cardiovascular: Normal rate, regular rhythm, normal heart sounds and intact distal pulses.  No murmur heard. Pulmonary/Chest: Effort normal and breath sounds normal. No respiratory distress.  Abdominal: Soft. There is no tenderness. There is no rebound and no guarding.  Musculoskeletal: Normal range of motion. He exhibits edema and tenderness.   Left dorsal forearm with extensive soft tissue infection.  Multiple areas of firm and indurated nodules. There are at least 3 separate discrete abscesses with fluctuance.   There is a draining lesion to the center of the forearm with surrounding areas of fluctuance. See photographs.  Intact radial pulse, cardinal hand movements intact.  Full range of motion of elbow and wrist.  Neurological: He is alert and oriented to person, place, and time. No cranial nerve deficit. He exhibits normal muscle tone. Coordination normal.  No ataxia on finger to nose bilaterally. No pronator drift. 5/5 strength throughout. CN 2-12 intact.Equal grip strength. Sensation intact.   Skin: Skin is warm.  Psychiatric: He has a normal mood and affect. His behavior is normal.  Nursing note and vitals reviewed.          ED Treatments / Results  Labs (all labs ordered are listed, but only abnormal results are displayed) Labs Reviewed - No data to display  EKG None  Radiology Dg Forearm Left  Result Date: 04/14/2018 CLINICAL DATA:  Multiple wounds in varying stages of healing in the arms and face. Large left arm wound. Question of foreign body. EXAM: LEFT FOREARM - 2 VIEW COMPARISON:  None. FINDINGS: Soft tissue defect over the dorsal aspect of the mid left forearm. No radiopaque soft tissue foreign bodies identified. Left radius and ulna appear intact. No acute fracture or dislocation. No focal bone lesion or bone destruction. No evidence of osteomyelitis. IMPRESSION: Soft tissue defect over the dorsal aspect of the mid left forearm. No radiopaque foreign bodies. No acute bony abnormalities. Electronically Signed   By: Burman NievesWilliam  Stevens M.D.   On: 04/14/2018 05:17    Procedures .Marland Kitchen.Incision and Drainage Date/Time: 04/14/2018 4:55 AM Performed by: Glynn Octaveancour, Julis Haubner, MD Authorized by: Glynn Octaveancour, Yida Hyams, MD   Consent:    Consent obtained:  Verbal   Consent given by:  Patient   Risks discussed:  Pain, incomplete drainage, bleeding, damage to other organs and infection Location:    Type:  Abscess   Size:  2   Location:  Upper extremity   Upper extremity location:  Arm   Arm  location:  L lower arm Pre-procedure details:    Skin preparation:  Betadine Anesthesia (see MAR for exact dosages):    Anesthesia method:  Local infiltration   Local anesthetic:  Lidocaine 2% WITH epi Procedure type:    Complexity:  Complex Procedure details:    Needle aspiration: no     Incision types:  Single straight   Incision depth:  Subcutaneous   Scalpel blade:  11   Wound management:  Probed and deloculated, irrigated with saline and extensive cleaning   Drainage:  Purulent   Drainage amount:  Moderate   Wound treatment:  Wound left open   Packing materials:  None Post-procedure details:    Patient tolerance of procedure:  Tolerated well, no immediate complications .Marland Kitchen.Incision and Drainage Date/Time: 04/14/2018 4:56 AM Performed by: Glynn Octaveancour, Kimmie Doren, MD Authorized by: Glynn Octaveancour, Maleiya Pergola, MD   Consent:    Consent obtained:  Verbal   Consent given by:  Patient  Risks discussed:  Bleeding, incomplete drainage, pain and damage to other organs Location:    Type:  Abscess   Size:  2   Location:  Upper extremity   Upper extremity location:  Arm   Arm location:  L lower arm Pre-procedure details:    Skin preparation:  Betadine Anesthesia (see MAR for exact dosages):    Anesthesia method:  Local infiltration   Local anesthetic:  Lidocaine 2% WITH epi Procedure type:    Complexity:  Complex Procedure details:    Incision types:  Single straight   Incision depth:  Subcutaneous   Scalpel blade:  11   Wound management:  Probed and deloculated, irrigated with saline and extensive cleaning   Drainage:  Purulent   Drainage amount:  Moderate   Packing materials:  None Post-procedure details:    Patient tolerance of procedure:  Tolerated well, no immediate complications .Marland Kitchen.Incision and Drainage Date/Time: 04/14/2018 4:57 AM Performed by: Glynn Octaveancour, Janaiya Beauchesne, MD Authorized by: Glynn Octaveancour, Renise Gillies, MD   Consent:    Consent obtained:  Verbal   Consent given by:  Patient   Risks  discussed:  Bleeding, incomplete drainage, pain, infection and damage to other organs Location:    Type:  Abscess   Size:  3   Location:  Upper extremity   Upper extremity location:  Arm   Arm location:  L lower arm Pre-procedure details:    Skin preparation:  Betadine Anesthesia (see MAR for exact dosages):    Anesthesia method:  Local infiltration   Local anesthetic:  Lidocaine 2% WITH epi Procedure type:    Complexity:  Complex Procedure details:    Incision types:  Single straight   Incision depth:  Subcutaneous   Scalpel blade:  11   Wound management:  Probed and deloculated, irrigated with saline and extensive cleaning   Drainage:  Purulent   Drainage amount:  Copious   Wound treatment:  Wound left open   Packing materials:  None Post-procedure details:    Patient tolerance of procedure:  Tolerated well, no immediate complications   (including critical care time)  Medications Ordered in ED Medications  lidocaine-EPINEPHrine (XYLOCAINE W/EPI) 2 %-1:200000 (PF) injection 20 mL (20 mLs Intradermal Not Given 04/14/18 0338)  lidocaine-EPINEPHrine (XYLOCAINE W/EPI) 2 %-1:200000 (PF) injection 20 mL (20 mLs Infiltration Given 04/14/18 0337)  povidone-iodine (BETADINE) 10 % ointment (3 application Topical Given 04/14/18 0339)  povidone-iodine (BETADINE) 10 % external solution (3 application  Given 04/14/18 0340)     Initial Impression / Assessment and Plan / ED Course  I have reviewed the triage vital signs and the nursing notes.  Pertinent labs & imaging results that were available during my care of the patient were reviewed by me and considered in my medical decision making (see chart for details).    Patient with soft tissue infection and abscesses to left forearm after IV drug abuse.  Denies fever.  He is neurovascularly intact.  Tetanus is up-to-date.  Nontoxic-appearing  X-ray will be obtained.  Incision and drainage performed of 3 of the largest lesions with large  amount of pus expressed. Discussed with patient that other small lesions are not amenable to I&D at this time.  Wound extensively irrigated and cleaned.  He will be started on antibiotics.  Recommend topical and as well as p.o. antibiotics.  Wound check in 2 days. Cease IV drug use.  Return precautions discussed.  Final Clinical Impressions(s) / ED Diagnoses   Final diagnoses:  Cellulitis and abscess of upper extremity  ED Discharge Orders    None       Dala Breault, Jeannett Senior, MD 04/14/18 [phone]

## 2018-04-14 NOTE — ED Notes (Signed)
Patient transported to X-ray 

## 2018-04-14 NOTE — ED Triage Notes (Signed)
Pt has multiple wounds in varying stages of healing to his arms and face.  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  Pt particularly wants the one on his left forearm looked at.

## 2018-04-14 NOTE — Discharge Instructions (Addendum)
Wash the wounds twice daily with soap and water.  Take the antibiotics as prescribed.  Follow-up for a wound check in 2 days in this department.  Stop using IV drugs.  Return to the ED if you develop worsening pain, fever, vomiting or other concerns.

## 2018-04-14 NOTE — ED Notes (Signed)
ED Provider at bedside. 

## 2019-04-02 ENCOUNTER — Encounter (HOSPITAL_COMMUNITY): Payer: Self-pay

## 2019-04-02 ENCOUNTER — Emergency Department (HOSPITAL_COMMUNITY)
Admission: EM | Admit: 2019-04-02 | Discharge: 2019-04-02 | Disposition: A | Discharge status: Home / Self Care | Payer: Self-pay | Attending: Emergency Medicine | Admitting: Emergency Medicine

## 2019-04-02 ENCOUNTER — Other Ambulatory Visit: Payer: Self-pay

## 2019-04-02 DIAGNOSIS — S76911A Strain of unspecified muscles, fascia and tendons at thigh level, right thigh, initial encounter: Secondary | ICD-10-CM | POA: Insufficient documentation

## 2019-04-02 DIAGNOSIS — Y939 Activity, unspecified: Secondary | ICD-10-CM | POA: Insufficient documentation

## 2019-04-02 DIAGNOSIS — X58XXXA Exposure to other specified factors, initial encounter: Secondary | ICD-10-CM | POA: Insufficient documentation

## 2019-04-02 DIAGNOSIS — Y999 Unspecified external cause status: Secondary | ICD-10-CM | POA: Insufficient documentation

## 2019-04-02 DIAGNOSIS — Z79899 Other long term (current) drug therapy: Secondary | ICD-10-CM | POA: Insufficient documentation

## 2019-04-02 DIAGNOSIS — Y929 Unspecified place or not applicable: Secondary | ICD-10-CM | POA: Insufficient documentation

## 2019-04-02 DIAGNOSIS — F17210 Nicotine dependence, cigarettes, uncomplicated: Secondary | ICD-10-CM | POA: Insufficient documentation

## 2019-04-02 MED ORDER — CYCLOBENZAPRINE HCL 10 MG PO TABS
10.0000 mg | ORAL_TABLET | Freq: Once | ORAL | Status: AC
Start: 2019-04-02 — End: 2019-04-02
  Administered 2019-04-02: 10 mg via ORAL
  Filled 2019-04-02: qty 1

## 2019-04-02 MED ORDER — CYCLOBENZAPRINE HCL 10 MG PO TABS: 10.0000 mg | ORAL_TABLET | Freq: Three times a day (TID) | ORAL | 0 refills | Status: DC

## 2019-04-02 MED ORDER — ONDANSETRON HCL 4 MG PO TABS
4.0000 mg | ORAL_TABLET | Freq: Once | ORAL | Status: AC
  Administered 2019-04-02: 10:00:00 4 mg via ORAL
  Filled 2019-04-02: qty 1

## 2019-04-02 MED ORDER — KETOROLAC TROMETHAMINE 10 MG PO TABS
10.0000 mg | ORAL_TABLET | Freq: Once | ORAL | Status: AC
  Administered 2019-04-02: 10 mg via ORAL
  Filled 2019-04-02: qty 1

## 2019-04-02 MED ORDER — PREDNISONE 20 MG PO TABS: 40.0000 mg | ORAL_TABLET | Freq: Every day | ORAL | 0 refills | Status: DC

## 2019-04-02 MED ORDER — PREDNISONE 20 MG PO TABS
40.0000 mg | ORAL_TABLET | Freq: Once | ORAL | Status: AC
  Administered 2019-04-02: 10:00:00 40 mg via ORAL
  Filled 2019-04-02: qty 2

## 2019-04-02 NOTE — ED Provider Notes (Signed)
[facility] Provider Note   CSN: [id number] Arrival date & time: 04/02/19  0820     History   Chief Complaint Chief Complaint  Patient presents with  . Leg Pain    HPI Taylor Carpenter is a 28 y.o. male.     Patient is a 28 year old male who presents to the emergency department with a complaint of leg pain.  The patient states that a few months ago he injured his back and his right leg.  He says since that time he has been having pain mostly in the's side of his right thigh.  There is been no direct injury to the right thigh.  The patient denies any back pain at this time.  The pain is worse when he is standing or walking.  He has tried some over-the-counter medications without any significant success.  He presents now for additional evaluation and management of this issue.  The history is provided by the patient.  Leg Pain Associated symptoms: back pain   Associated symptoms: no neck pain     Past Medical History:  Diagnosis Date  . Asthma   . 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 Pt reports starting drugs at 14 and IV drugs 26    There are no active problems to display for this patient.   Past Surgical History:  Procedure Laterality Date  . APPENDECTOMY          Home Medications    Prior to Admission medications   Medication Sig Start Date End Date Taking? Authorizing Provider  bacitracin ointment Apply 1 application topically 2 (two) times daily. 04/14/18   Rancour, Jeannett SeniorStephen, MD  clindamycin (CLEOCIN) 300 MG capsule Take 1 capsule (300 mg total) by mouth 3 (three) times daily. 04/14/18   Rancour, Jeannett SeniorStephen, MD  HYDROcodone-acetaminophen (NORCO/VICODIN) 5-325 MG per tablet Take one-two tabs po q 4-6 hrs prn pain 08/25/13   Triplett, Tammy, PA-C  ibuprofen (ADVIL,MOTRIN) 600 MG tablet Take 1 tablet (600 mg total) by mouth every 6 (six) hours as needed for headache or moderate pain. 09/07/17   Burgess AmorIdol, Julie, PA-C    Family  History No family history on file.  Social History Social History   Tobacco Use  . Smoking status: Current Every Day Smoker    Packs/day: 1.00    Types: Cigarettes  . Smokeless tobacco: Never Used  Substance Use Topics  . Alcohol use: Yes  . Drug use: Yes    Types: Marijuana, Cocaine, IV    Comment: heroine addict -denies any since 9-2      Allergies   Amoxil [amoxicillin]   Review of Systems Review of Systems  Constitutional: Negative for activity change and appetite change.  HENT: Negative for congestion, ear discharge, ear pain, facial swelling, nosebleeds, rhinorrhea, sneezing and tinnitus.   Eyes: Negative for photophobia, pain and discharge.  Respiratory: Negative for cough, choking, shortness of breath and wheezing.   Cardiovascular: Negative for chest pain, palpitations and leg swelling.  Gastrointestinal: Negative for abdominal pain, blood in stool, constipation, diarrhea, nausea and vomiting.  Genitourinary: Negative for difficulty urinating, dysuria, flank pain, frequency and hematuria.  Musculoskeletal: Positive for back pain. Negative for gait problem, myalgias and neck pain.  Skin: Negative for color change, rash and wound.  Neurological: Negative for dizziness, seizures, syncope, facial asymmetry, speech difficulty, weakness and numbness.  Hematological: Negative for adenopathy. Does not bruise/bleed easily.  Psychiatric/Behavioral: Negative for agitation, confusion, hallucinations, self-injury and suicidal ideas. The  patient is not nervous/anxious.      Physical Exam Updated Vital Signs BP 128/75 (BP Location: Left Arm)   Pulse 78   Temp 98.2 F (36.8 C) (Oral)   Resp 18   Wt 58 kg   SpO2 98%   BMI 19.44 kg/m   Physical Exam Vitals signs and nursing note reviewed.  Constitutional:      Appearance: He is well-developed. He is not toxic-appearing.  HENT:     Head: Normocephalic.     Right Ear: Tympanic membrane and external ear normal.     Left  Ear: Tympanic membrane and external ear normal.  Eyes:     General: Lids are normal.     Pupils: Pupils are equal, round, and reactive to light.  Neck:     Musculoskeletal: Normal range of motion and neck supple.     Vascular: No carotid bruit.  Cardiovascular:     Rate and Rhythm: Normal rate and regular rhythm.     Pulses: Normal pulses.     Heart sounds: Normal heart sounds.  Pulmonary:     Effort: No respiratory distress.     Breath sounds: Normal breath sounds.  Abdominal:     General: Bowel sounds are normal.     Palpations: Abdomen is soft.     Tenderness: There is no abdominal tenderness. There is no guarding.  Musculoskeletal: Normal range of motion.        General: Tenderness present.       Legs:  Lymphadenopathy:     Head:     Right side of head: No submandibular adenopathy.     Left side of head: No submandibular adenopathy.     Cervical: No cervical adenopathy.  Skin:    General: Skin is warm and dry.  Neurological:     Mental Status: He is alert and oriented to person, place, and time.     Cranial Nerves: No cranial nerve deficit.     Sensory: No sensory deficit.  Psychiatric:        Speech: Speech normal.      ED Treatments / Results  Labs (all labs ordered are listed, but only abnormal results are displayed) Labs Reviewed - No data to display  EKG None  Radiology No results found.  Procedures Procedures (including critical care time)  Medications Ordered in ED Medications - No data to display   Initial Impression / Assessment and Plan / ED Course  I have reviewed the triage vital signs and the nursing notes.  Pertinent labs & imaging results that were available during my care of the patient were reviewed by me and considered in my medical decision making (see chart for details).          Final Clinical Impressions(s) / ED Diagnoses MDM  Vital signs within normal limits.  Pulse oximetry is 98% on room air.  Within normal limits by  my interpretation.  There are no gross neurologic deficits appreciated on examination.  In particular there is no numbness or sensory loss in the saddle area.  There is no pain to palpation of the lumbar spine.  There is no palpable step-off of the lumbar spine.  The examination favors a muscle strain involving the right thigh. Patient will be treated with Flexeril, prednisone, Tylenol, and/or ibuprofen.  The patient is to follow-up with orthopedics if not improving.   Final diagnoses:  Muscle strain of right thigh, initial encounter    ED Discharge Orders  Ordered    cyclobenzaprine (FLEXERIL) 10 MG tablet  3 times daily,   Status:  Discontinued     04/02/19 0933    predniSONE (DELTASONE) 20 MG tablet  Daily     04/02/19 0933    cyclobenzaprine (FLEXERIL) 10 MG tablet  3 times daily     04/02/19 0935           Lily Kocher, PA-C 04/03/19 1615    Francine Graven, DO 04/06/19 [phone]

## 2019-04-02 NOTE — Discharge Instructions (Signed)
Your vital signs are within normal limits.  Your examination favors a muscle strain involving your right thigh.  Please rest your thigh as much as possible.  Use warm Epson salt tub soaks daily over the next week.  Use 600 mg of ibuprofen with breakfast, lunch, dinner, and at bedtime.  Use prednisone daily with food.  Use Flexeril 3 times daily for spasm pain.  Flexeril may cause drowsiness, and/or lightheadedness.  Please do not drive a vehicle, operate machinery, drink alcohol, or participate in activities requiring concentration when taking this medication.

## 2019-04-02 NOTE — ED Triage Notes (Signed)
Pt reports twisted his back a few months ago and has had intermittent pain in back and r leg since then.  Denies new injury.  Pain worse with walking.

## 2019-04-12 ENCOUNTER — Emergency Department (HOSPITAL_COMMUNITY): Payer: Self-pay

## 2019-04-12 ENCOUNTER — Encounter (HOSPITAL_COMMUNITY): Payer: Self-pay | Admitting: Emergency Medicine

## 2019-04-12 ENCOUNTER — Emergency Department (HOSPITAL_COMMUNITY)
Admission: EM | Admit: 2019-04-12 | Discharge: 2019-04-12 | Disposition: A | Discharge status: Home / Self Care | Payer: Self-pay | Attending: Emergency Medicine | Admitting: Emergency Medicine

## 2019-04-12 ENCOUNTER — Other Ambulatory Visit: Payer: Self-pay

## 2019-04-12 DIAGNOSIS — J45909 Unspecified asthma, uncomplicated: Secondary | ICD-10-CM | POA: Insufficient documentation

## 2019-04-12 DIAGNOSIS — Z79899 Other long term (current) drug therapy: Secondary | ICD-10-CM | POA: Insufficient documentation

## 2019-04-12 DIAGNOSIS — M25551 Pain in right hip: Secondary | ICD-10-CM | POA: Insufficient documentation

## 2019-04-12 DIAGNOSIS — F17210 Nicotine dependence, cigarettes, uncomplicated: Secondary | ICD-10-CM | POA: Insufficient documentation

## 2019-04-12 MED ORDER — METHOCARBAMOL 500 MG PO TABS: 500.0000 mg | ORAL_TABLET | Freq: Four times a day (QID) | ORAL | 0 refills | Status: DC

## 2019-04-12 MED ORDER — DICLOFENAC SODIUM 75 MG PO TBEC: 75.0000 mg | DELAYED_RELEASE_TABLET | Freq: Two times a day (BID) | ORAL | 0 refills | Status: DC

## 2019-04-12 MED ORDER — KETOROLAC TROMETHAMINE 30 MG/ML IJ SOLN
30.0000 mg | Freq: Once | INTRAMUSCULAR | Status: AC
Start: 2019-04-12 — End: 2019-04-12
  Administered 2019-04-12: 12:00:00 30 mg via INTRAMUSCULAR
  Filled 2019-04-12: qty 1

## 2019-04-12 NOTE — ED Notes (Signed)
Notified xray to get pt for xray.

## 2019-04-12 NOTE — ED Provider Notes (Signed)
[facility]   CSN: [id number] Arrival date & time: 04/12/19  1045    History   Chief Complaint Chief Complaint  Patient presents with  . Hip Pain    right    HPI Taylor Carpenter is a 28 y.o. male with a distant h/o asthma, and polysubstance abuse presenting with persistent right buttock and thigh pain which first started intermittently but has progressed to constant pain which is worsened with weight bearing which causes radiculopathy of burning sensation to his right calf. He denies low back pain or injury, stating the pain starts in his mid right buttock region, but states he cannot stand straight secondary to pain which does cause his back to ache. He describes having to swing his leg up to shoulder height at work then pull on fabric coming through the machine when it gets "hung" which may have triggered his sx, but again, denies exact inciting event.   He denies weakness or numbness in his legs, also denies fevers, chills, no urinary or fecal incontinence or retention.  He has tried flexeril, steroids and ibuprofen along with ice packs (since last visit here) with worsening symptoms.      The history is provided by the patient.    Past Medical History:  Diagnosis Date  . Asthma   . 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 Pt reports starting drugs at 14 and IV drugs 26    There are no active problems to display for this patient.   Past Surgical History:  Procedure Laterality Date  . APPENDECTOMY          Home Medications    Prior to Admission medications   Medication Sig Start Date End Date Taking? Authorizing Provider  albuterol (VENTOLIN HFA) 108 (90 Base) MCG/ACT inhaler Inhale 1-2 puffs into the lungs every 6 (six) hours as needed for wheezing or shortness of breath.   Yes [provider]  ibuprofen (ADVIL,MOTRIN) 600 MG tablet Take 1 tablet (600 mg total) by mouth every 6 (six) hours as needed for  headache or moderate pain. 09/07/17  Yes Micca Matura, Raynelle FanningJulie, PA-C  bacitracin ointment Apply 1 application topically 2 (two) times daily. Patient not taking: Reported on 04/12/2019 04/14/18   Glynn Octaveancour, Stephen, MD  clindamycin (CLEOCIN) 300 MG capsule Take 1 capsule (300 mg total) by mouth 3 (three) times daily. Patient not taking: Reported on 04/12/2019 04/14/18   Glynn Octaveancour, Stephen, MD  cyclobenzaprine (FLEXERIL) 10 MG tablet Take 1 tablet (10 mg total) by mouth 3 (three) times daily. Patient not taking: Reported on 04/12/2019 04/02/19   Ivery QualeBryant, Hobson, PA-C  HYDROcodone-acetaminophen (NORCO/VICODIN) 5-325 MG per tablet Take one-two tabs po q 4-6 hrs prn pain Patient not taking: Reported on 04/12/2019 08/25/13   Triplett, Tammy, PA-C  predniSONE (DELTASONE) 20 MG tablet Take 2 tablets (40 mg total) by mouth daily. Patient not taking: Reported on 04/12/2019 04/02/19   Ivery QualeBryant, Hobson, PA-C    Family History History reviewed. No pertinent family history.  Social History Social History   Tobacco Use  . Smoking status: Current Every Day Smoker    Packs/day: 1.00    Types: Cigarettes  . Smokeless tobacco: Never Used  Substance Use Topics  . Alcohol use: Yes  . Drug use: Yes    Types: Marijuana, Cocaine, IV    Comment: heroine addict -denies any since 9-2      Allergies   Amoxil [amoxicillin]   Review of Systems Review of Systems  Constitutional: Negative for chills and fever.  Respiratory: Negative for shortness of breath.   Cardiovascular: Negative for chest pain and leg swelling.  Gastrointestinal: Negative for abdominal distention, abdominal pain and constipation.  Genitourinary: Negative for difficulty urinating, dysuria, flank pain, frequency and urgency.  Musculoskeletal: Positive for arthralgias. Negative for gait problem and joint swelling.  Skin: Negative for rash.  Neurological: Negative for dizziness, weakness and numbness.  All other systems reviewed and are negative.    Physical  Exam Updated Vital Signs BP (!) 126/94   Pulse 83   Temp 98.4 F (36.9 C) (Oral)   Resp 18   Ht 5\' 8"  (1.727 m)   Wt 63.5 kg   SpO2 100%   BMI 21.29 kg/m   Physical Exam Vitals signs and nursing note reviewed.  Constitutional:      Appearance: He is well-developed.  HENT:     Head: Normocephalic.  Eyes:     Conjunctiva/sclera: Conjunctivae normal.  Neck:     Musculoskeletal: Normal range of motion and neck supple.  Cardiovascular:     Rate and Rhythm: Normal rate.     Comments: Pedal pulses normal. Pulmonary:     Effort: Pulmonary effort is normal.  Abdominal:     General: Bowel sounds are normal. There is no distension.     Palpations: Abdomen is soft. There is no mass.  Musculoskeletal: Normal range of motion.     Lumbar back: He exhibits no tenderness, no swelling, no edema and no spasm.     Comments: No midline lumbar pain, step-off or deformity.  He is tender to palpation at his right SI joint.  Nontender over right hip greater trochanter.  Patient has full internal/external, abduction, abduction of right hip both active and passive without pain.  He does have a positive straight leg raise left.  Skin:    General: Skin is warm and dry.  Neurological:     Mental Status: He is alert.     Sensory: No sensory deficit.     Motor: No tremor or atrophy.     Gait: Gait normal.     Deep Tendon Reflexes:     Reflex Scores:      Patellar reflexes are 2+ on the right side and 2+ on the left side.      Achilles reflexes are 2+ on the right side and 2+ on the left side.    Comments: No strength deficit noted in hip and knee flexor and extensor muscle groups.  Ankle flexion and extension intact.      ED Treatments / Results  Labs (all labs ordered are listed, but only abnormal results are displayed) Labs Reviewed - No data to display  EKG None  Radiology Dg Lumbar Spine Complete  Result Date: 04/12/2019 CLINICAL DATA:  Right flank pain. EXAM: LUMBAR SPINE - COMPLETE  4+ VIEW COMPARISON:  No prior. FINDINGS: Paraspinal soft tissues are normal. No acute bony abnormality. No evidence of fracture. Stool noted throughout the colon. IMPRESSION: No acute abnormality. Electronically Signed   By: Maisie Fushomas  Register   On: 04/12/2019 13:35    Procedures Procedures (including critical care time)  Medications Ordered in ED Medications  ketorolac (TORADOL) 30 MG/ML injection 30 mg (30 mg Intramuscular Given 04/12/19 1148)     Initial Impression / Assessment and Plan / ED Course  I have reviewed the triage vital signs and the nursing notes.  Pertinent labs & imaging results that were available during my care of the patient were  reviewed by me and considered in my medical decision making (see chart for details).        Imaging reviewed with no acute findings on lumbar spine. Pt without skin changes/abscess, focal lumbar induration or pain.  Referral to ortho as previously referred (Dr. Aline Brochure).  Robaxin, heat tx discussed.  Pt can range his hip joint, no evidence for joint infection. Radicular pain suggests sciatica.    Pt seen by Dr. Roslynn Amble prior to dc home.   Final Clinical Impressions(s) / ED Diagnoses   Final diagnoses:  Right hip pain    ED Discharge Orders    None       Landis Martins 04/12/19 1445    Lucrezia Starch, MD 04/13/19 1229

## 2019-04-12 NOTE — ED Triage Notes (Signed)
Right for one month and deny injury.  Rates pain 8/10.  Taylor Carpenter in [place] on August 4th and Urgent Care on 8/10.  Pt report pain has not gotten any better.  Treated with steroids, muscle relaxer and Naprosyn.

## 2019-04-12 NOTE — Discharge Instructions (Addendum)
Avoid lifting,  Bending,  Twisting or any other activity that worsens your pain over the next week.  Apply a heating pad to your lower back and hip area for 20 minutes several times daily.   You should get rechecked if your symptoms are not better over the next week (call Dr. Aline Brochure for further evaluation as discussed) or you develop increased pain,  weakness in your leg(s) or loss of bladder or bowel function - these are symptoms of a worse injury.

## 2019-08-10 ENCOUNTER — Emergency Department (HOSPITAL_COMMUNITY)
Admission: EM | Admit: 2019-08-10 | Discharge: 2019-08-10 | Disposition: A | Discharge status: Home / Self Care | Payer: Self-pay | Attending: Emergency Medicine | Admitting: Emergency Medicine

## 2019-08-10 ENCOUNTER — Encounter (HOSPITAL_COMMUNITY): Payer: Self-pay | Admitting: Emergency Medicine

## 2019-08-10 ENCOUNTER — Other Ambulatory Visit: Payer: Self-pay

## 2019-08-10 ENCOUNTER — Emergency Department (HOSPITAL_BASED_OUTPATIENT_CLINIC_OR_DEPARTMENT_OTHER): Payer: Self-pay

## 2019-08-10 DIAGNOSIS — Z791 Long term (current) use of non-steroidal anti-inflammatories (NSAID): Secondary | ICD-10-CM | POA: Insufficient documentation

## 2019-08-10 DIAGNOSIS — J45909 Unspecified asthma, uncomplicated: Secondary | ICD-10-CM | POA: Insufficient documentation

## 2019-08-10 DIAGNOSIS — M79609 Pain in unspecified limb: Secondary | ICD-10-CM

## 2019-08-10 DIAGNOSIS — M79604 Pain in right leg: Secondary | ICD-10-CM | POA: Insufficient documentation

## 2019-08-10 DIAGNOSIS — F17210 Nicotine dependence, cigarettes, uncomplicated: Secondary | ICD-10-CM | POA: Insufficient documentation

## 2019-08-10 MED ORDER — ACETAMINOPHEN 500 MG PO TABS: 1000.0000 mg | ORAL_TABLET | Freq: Once | ORAL | Status: DC

## 2019-08-10 MED ORDER — MELOXICAM 7.5 MG PO TABS: 7.5000 mg | ORAL_TABLET | Freq: Every day | ORAL | 0 refills | Status: DC

## 2019-08-10 NOTE — ED Notes (Signed)
Patient verbalizes understanding of discharge instructions. Opportunity for questioning and answers were provided. Armband removed by staff, pt discharged from ED.  

## 2019-08-10 NOTE — ED Triage Notes (Signed)
C/o posterior R upper leg pain since July.  States he has been seen at [facility] and [facility] and hasn't gotten any better.

## 2019-08-10 NOTE — ED Provider Notes (Signed)
[facility]   CSN: [id number] Arrival date & time: 08/10/19  1459     History Chief Complaint  Patient presents with  . Leg Pain    Taylor PaganiniWilliam J Carpenter is a 28 y.o. male presenting for evaluation of right leg pain.  Patient states for the past 5 months, he has been having persistent right leg pain.  Is constant.  Located mostly posteriorly of the right thigh, pain radiates to the right calf.  It is described as a throbbing and sharp pain.  He has been seen twice in the ED for this, sometimes gets temporary improvement with NSAIDs and steroids, however pain always returns.  He denies obvious precipitating injury, but states that he does need to lift his leg up and over objects at work frequently.  He denies fall, trauma, or injury.  He denies fevers, chills, nausea, vomiting, urinary symptoms, loss of bowel bladder control, numbness, tingling.  He has no other medical problems, takes no medications daily.  He has not followed up with orthopedics, despite being recommended to do so x2.  Additional history obtained from chart review.  Patient with a history of asthma and polysubstance abuse.  HPI     Past Medical History:  Diagnosis Date  . Asthma   . 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 Pt reports starting drugs at 14 and IV drugs 26    There are no problems to display for this patient.   Past Surgical History:  Procedure Laterality Date  . APPENDECTOMY         No family history on file.  Social History   Tobacco Use  . Smoking status: Current Every Day Smoker    Packs/day: 1.00    Types: Cigarettes  . Smokeless tobacco: Never Used  Substance Use Topics  . Alcohol use: Yes  . Drug use: Yes    Types: Marijuana, Cocaine, IV    Comment: heroine addict -denies any since 9-2     Home Medications Prior to Admission medications   Medication Sig Start Date End Date Taking? Authorizing Provider   albuterol (VENTOLIN HFA) 108 (90 Base) MCG/ACT inhaler Inhale 1-2 puffs into the lungs every 6 (six) hours as needed for wheezing or shortness of breath.    [provider]  bacitracin ointment Apply 1 application topically 2 (two) times daily. Patient not taking: Reported on 04/12/2019 04/14/18   Glynn Octaveancour, Stephen, MD  clindamycin (CLEOCIN) 300 MG capsule Take 1 capsule (300 mg total) by mouth 3 (three) times daily. Patient not taking: Reported on 04/12/2019 04/14/18   Glynn Octaveancour, Stephen, MD  cyclobenzaprine (FLEXERIL) 10 MG tablet Take 1 tablet (10 mg total) by mouth 3 (three) times daily. Patient not taking: Reported on 04/12/2019 04/02/19   Ivery QualeBryant, Hobson, PA-C  diclofenac (VOLTAREN) 75 MG EC tablet Take 1 tablet (75 mg total) by mouth 2 (two) times daily. 04/12/19   Burgess AmorIdol, Julie, PA-C  HYDROcodone-acetaminophen (NORCO/VICODIN) 5-325 MG per tablet Take one-two tabs po q 4-6 hrs prn pain Patient not taking: Reported on 04/12/2019 08/25/13   Triplett, Tammy, PA-C  meloxicam (MOBIC) 7.5 MG tablet Take 1 tablet (7.5 mg total) by mouth daily. 08/10/19   Maysen Sudol, PA-C  methocarbamol (ROBAXIN) 500 MG tablet Take 1 tablet (500 mg total) by mouth 4 (four) times daily. 04/12/19   Burgess AmorIdol, Julie, PA-C    Allergies    Amoxil [amoxicillin]  Review of Systems   Review of Systems  Musculoskeletal:  Positive for myalgias.  Neurological: Negative for numbness.    Physical Exam Updated Vital Signs BP 120/78 (BP Location: Right Arm)   Pulse 92   Temp (!) 97.4 F (36.3 C) (Oral)   Resp 20   SpO2 99%   Physical Exam Vitals and nursing note reviewed.  Constitutional:      General: He is not in acute distress.    Appearance: He is well-developed.     Comments: Resting comfortably in the bed in no acute distress  HENT:     Head: Normocephalic and atraumatic.  Cardiovascular:     Rate and Rhythm: Normal rate and regular rhythm.     Pulses: Normal pulses.  Pulmonary:     Effort: Pulmonary  effort is normal.     Breath sounds: Normal breath sounds.  Abdominal:     General: There is no distension.     Palpations: There is no mass.     Tenderness: There is no abdominal tenderness. There is no guarding or rebound.  Musculoskeletal:        General: Tenderness present. Normal range of motion.     Cervical back: Normal range of motion.     Comments: Tenderness palpation of posterior right upper leg.  No obvious erythema, warmth, induration.  Pain radiates to the calf with straight leg raise.  No tenderness with palpation of the calf at rest.  Pedal pulses 2+ bilaterally.  Good distal cap refill.  Patellar reflexes equal bilaterally.  No saddle paresthesias. No tenderness palpation of lumbar spine.  No step-offs or deformities.  Slight increased tenderness with palpation over the SI joint on the right side.  Skin:    General: Skin is warm.     Capillary Refill: Capillary refill takes less than 2 seconds.     Findings: No rash.  Neurological:     Mental Status: He is alert and oriented to person, place, and time.     ED Results / Procedures / Treatments   Labs (all labs ordered are listed, but only abnormal results are displayed) Labs Reviewed - No data to display  EKG None  Radiology VAS [place] LOWER EXTREMITY VENOUS (DVT) (ONLY [facility] & [facility] 7a-7p)  Result Date: 08/10/2019  Lower Venous Study Indications: Sciatic pain, hip and thigh.  Risk Factors: IV drug abuse. Limitations: Patient could not extend leg, unable to turn off lights in room. Comparison Study: No prior study on file for comparison. Performing Technologist: Sharion Dove RVS  Examination Guidelines: A complete evaluation includes B-mode imaging, spectral Doppler, color Doppler, and power Doppler as needed of all accessible portions of each vessel. Bilateral testing is considered an integral part of a complete examination. Limited examinations for reoccurring indications may be performed as noted.   +---------+---------------+---------+-----------+----------+-------------------+ RIGHT    CompressibilityPhasicitySpontaneityPropertiesThrombus Aging      +---------+---------------+---------+-----------+----------+-------------------+ CFV      Full           Yes      Yes                                      +---------+---------------+---------+-----------+----------+-------------------+ SFJ      Full                                                             +---------+---------------+---------+-----------+----------+-------------------+  FV Prox  Full                                                             +---------+---------------+---------+-----------+----------+-------------------+ FV Mid   Full                                                             +---------+---------------+---------+-----------+----------+-------------------+ FV DistalFull                                                             +---------+---------------+---------+-----------+----------+-------------------+ PFV      Full                                                             +---------+---------------+---------+-----------+----------+-------------------+ POP      Full           No       No                   patient could not                                                         extend leg          +---------+---------------+---------+-----------+----------+-------------------+ PTV      Full                                                             +---------+---------------+---------+-----------+----------+-------------------+ PERO                                                  Not visualized      +---------+---------------+---------+-----------+----------+-------------------+   Left Technical Findings: Left leg not evaluated.   Summary: Right: There is no evidence of deep vein thrombosis in the lower extremity. However, portions of this  examination were limited- see technologist comments above.  *See table(s) above for measurements and observations.    Preliminary     Procedures Procedures (including critical care time)  Medications Ordered in ED Medications  acetaminophen (TYLENOL) tablet 1,000 mg (has no administration in time range)    ED Course  I have reviewed the triage vital signs and the nursing notes.  Pertinent labs &  imaging results that were available during my care of the patient were reviewed by me and considered in my medical decision making (see chart for details).    MDM Rules/Calculators/A&P     CHA2DS2/VAS Stroke Risk Points      N/A >= 2 Points: High Risk  1 - 1.99 Points: Medium Risk  0 Points: Low Risk    A final score could not be computed because of missing components.: Last  Change: N/A     This score determines the patient's risk of having a stroke if the  patient has atrial fibrillation.      This score is not applicable to this patient. Components are not  calculated.                   Patient presenting for evaluation of right leg pain.  This has been present for 5 months.  Physical exam reassuring, he appears neurovascularly intact.  No signs of infection.  No tenderness palpation over midline spine.  Despite his history of drug use, low suspicion for epidural abscess or discitis, as patient remains afebrile, he has had many months of pain, and he has no signs of infection at this time.  He has no weakness.  No fall to indicate fracture.  Patient has had a negative x-ray.  As his pain radiates to his calf, consider possible DVT, especially as pain is posterior.  Ultrasound negative for DVT.  As such, likely MSK.  Discussed continued symptomatic treatment at home and importance of follow-up with orthopedics.  At this time, patient appears safe for discharge.  Return precautions given.  Patient states he understands and agrees to plan.  Final Clinical Impression(s) / ED Diagnoses  Final diagnoses:  Right leg pain    Rx / DC Orders ED Discharge Orders         Ordered    meloxicam (MOBIC) 7.5 MG tablet  Daily     08/10/19 2002           Alveria Apley, PA-C 08/10/19 2132    Milagros Loll, MD 08/11/19 [phone]

## 2019-08-10 NOTE — Discharge Instructions (Addendum)
Take mobic daily with meals.  Do not take other anti-inflammatories at the same time (ibuprofen, Advil, Motrin, naproxen, Aleve). You may supplement with Tylenol if you need further pain control. Use ice packs or heating pads if this helps control your pain. It is extremely important that you follow-up with Dr. Aline Brochure (listed below) for further evaluation of your symptoms. Return to the emergency room if you develop severe worsening pain, numbness, loss of bowel bladder control, fevers, redness and swelling, or any new, worsening, or concerning symptoms.

## 2019-08-10 NOTE — Progress Notes (Signed)
VASCULAR LAB PRELIMINARY  PRELIMINARY  PRELIMINARY  PRELIMINARY  Right lower extremity venous duplex completed.    Preliminary report:  See CV proc for preliminary results.   [address] [place], PA-C, report  Parisha Beaulac, RVT 08/10/2019, 7:28 PM

## 2019-11-23 IMAGING — DX LUMBAR SPINE - COMPLETE 4+ VIEW
5 series · 5 of 5 positions shown · non-contrast
Comparison: No prior.

CLINICAL DATA: Right flank pain.

EXAM:
LUMBAR SPINE - COMPLETE 4+ VIEW

[l-spine ap]
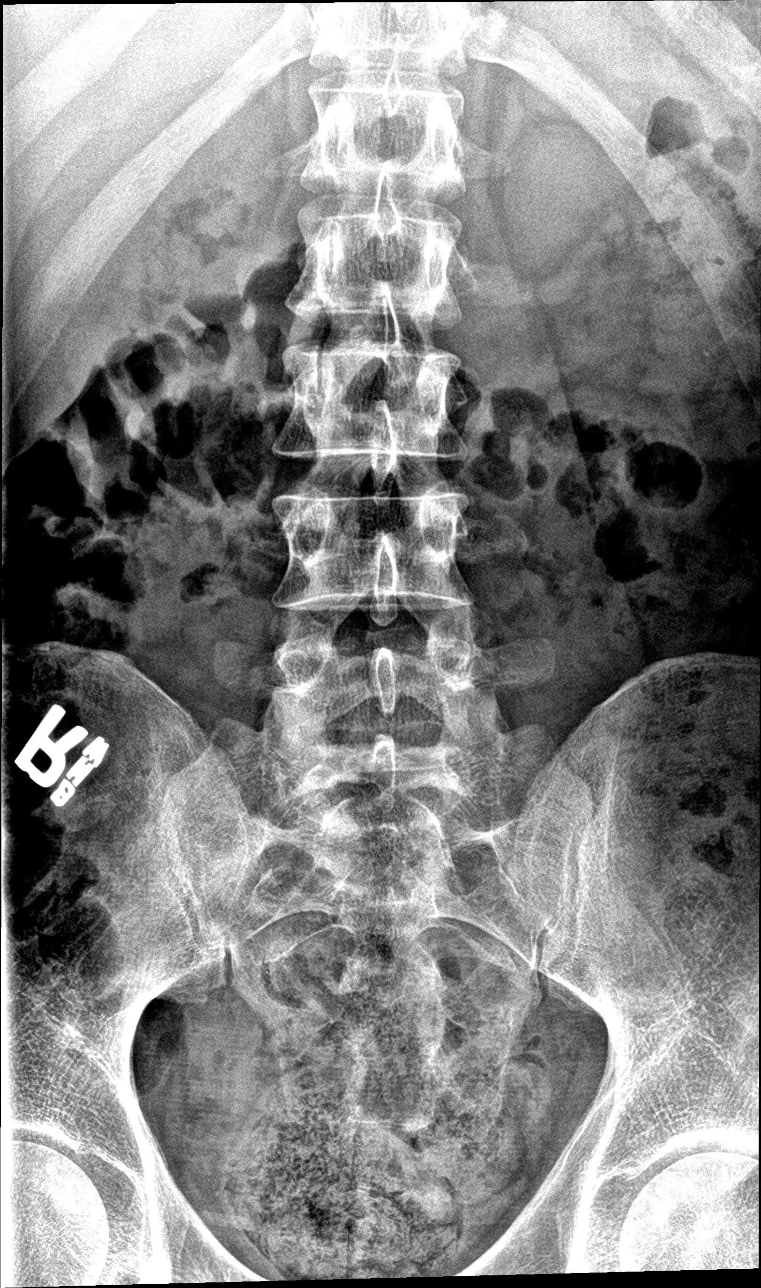

[l-spine obl (1 of 2)]
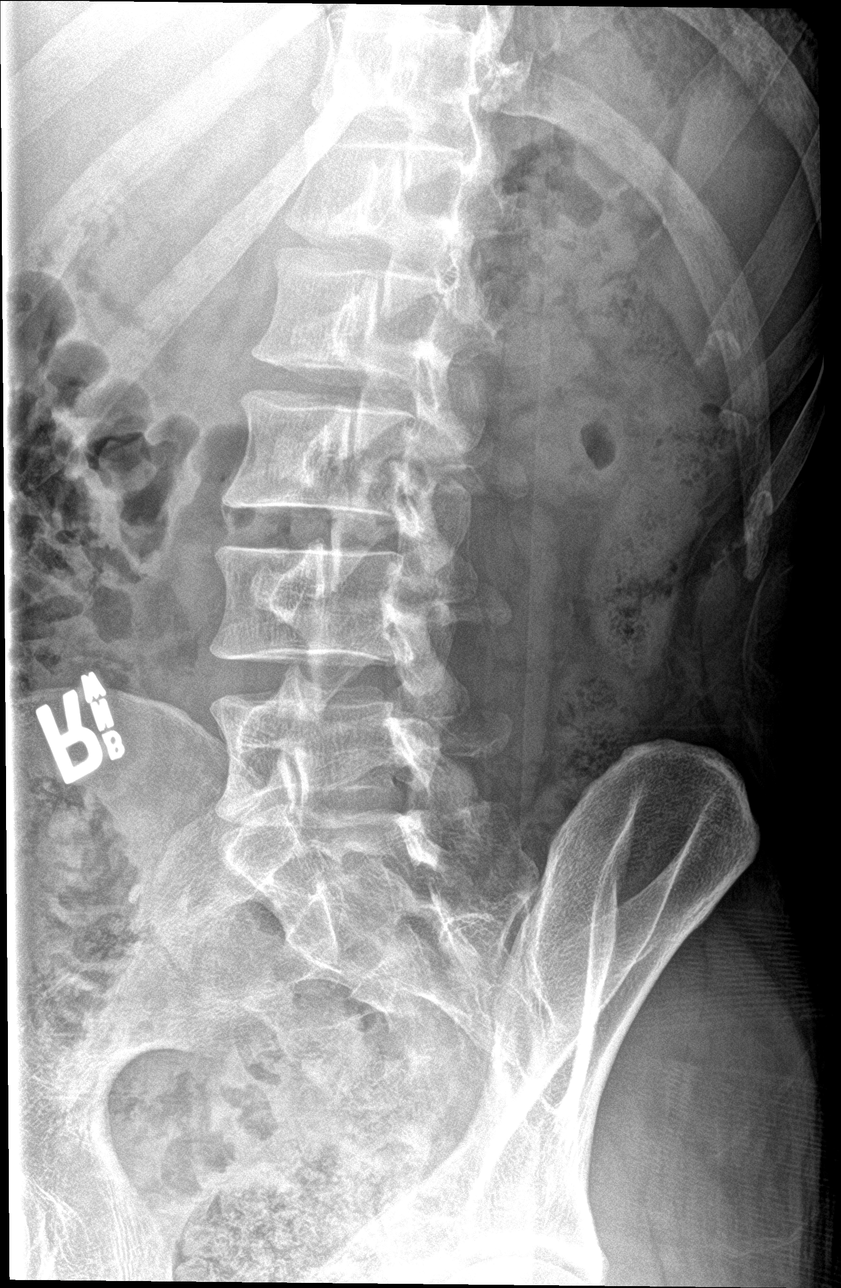

[l-spine obl (2 of 2)]
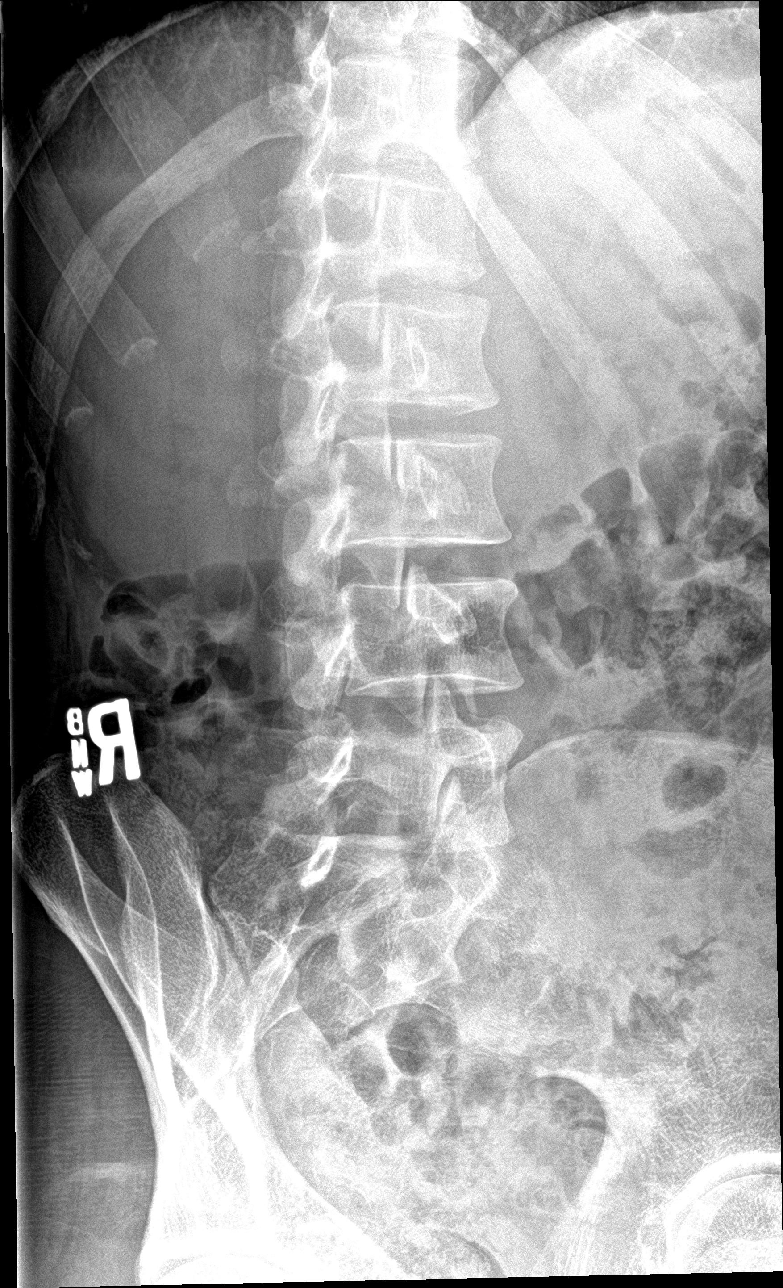

[l-spine lat]
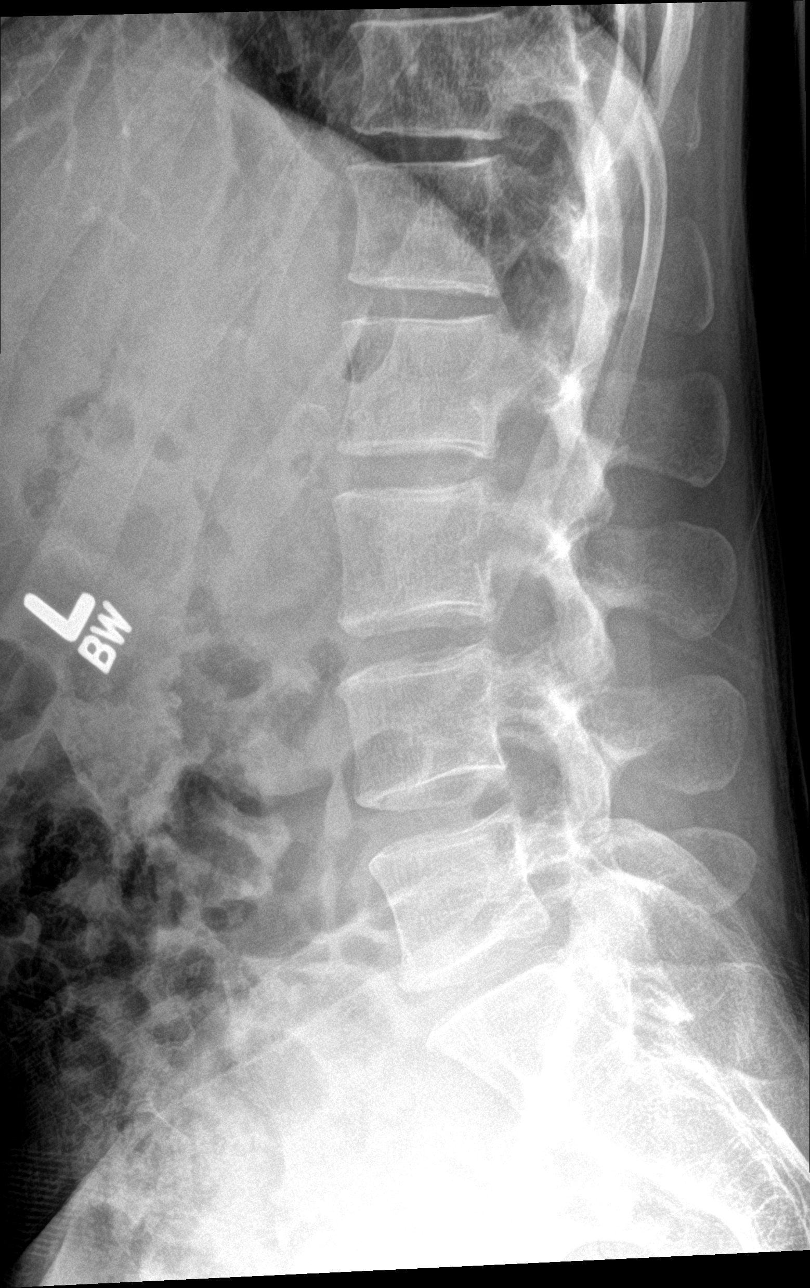

[l-spine spot]
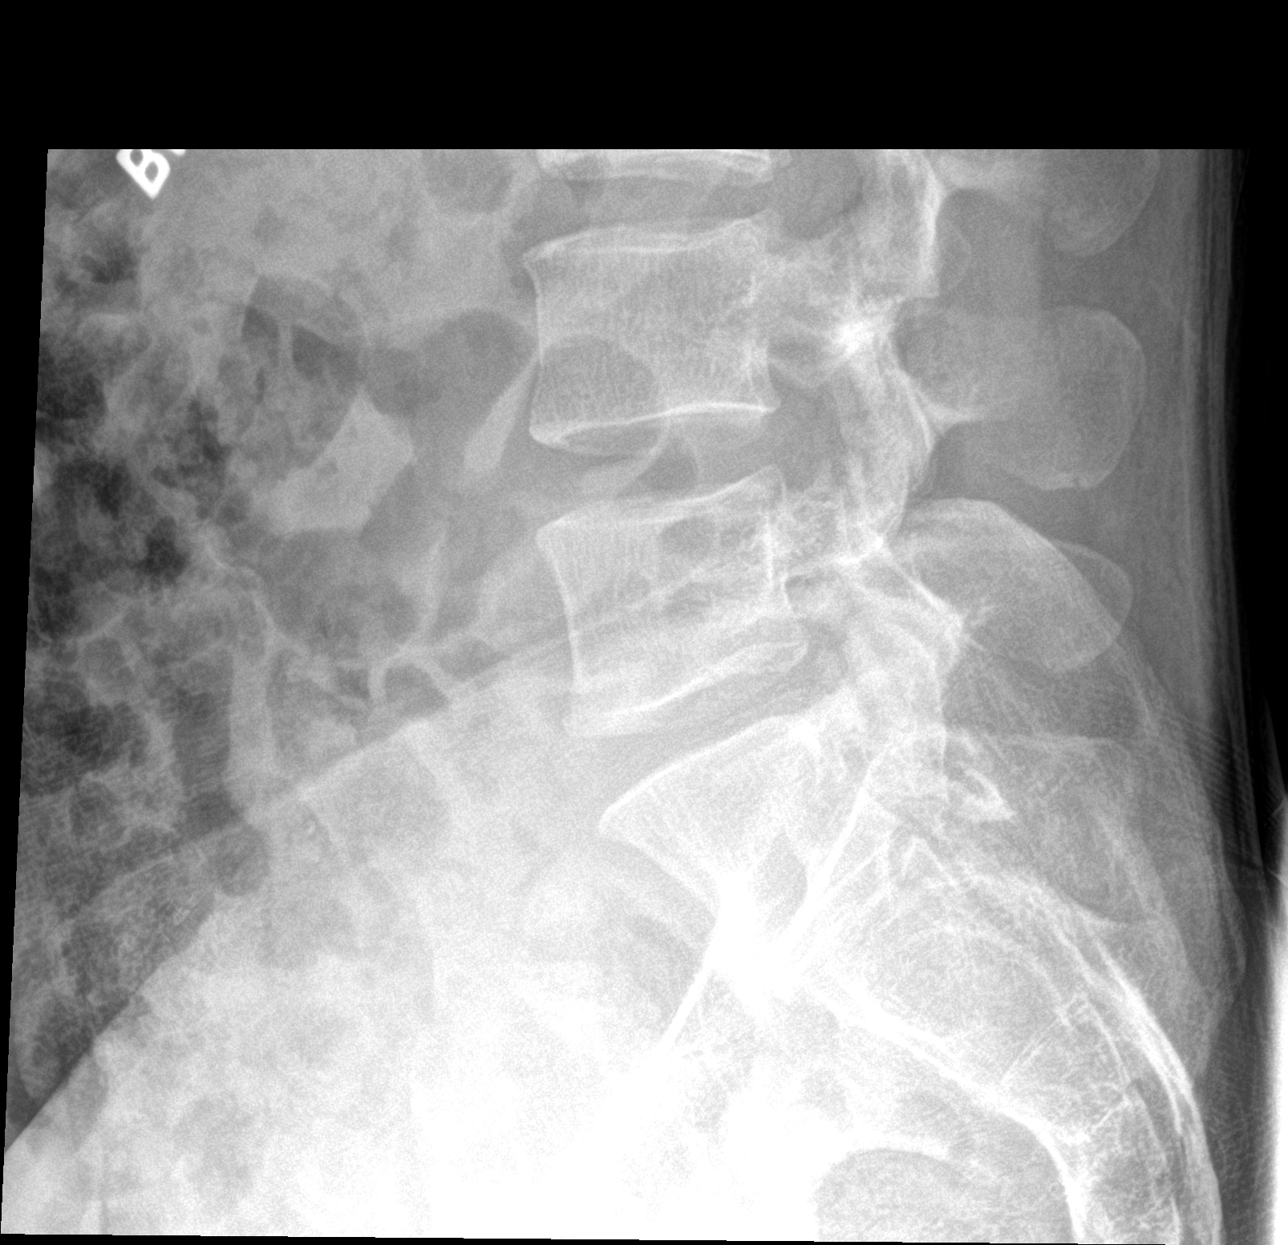

[5 of 5 positions shown; findings below may reference images not displayed]

FINDINGS: Paraspinal soft tissues are normal. No acute bony abnormality. No
evidence of fracture. Stool noted throughout the colon.
IMPRESSION: No acute abnormality.

## 2022-03-04 DIAGNOSIS — R69 Illness, unspecified: Secondary | ICD-10-CM | POA: Diagnosis not present

## 2022-03-04 DIAGNOSIS — R4182 Altered mental status, unspecified: Secondary | ICD-10-CM | POA: Diagnosis not present

## 2024-05-10 ENCOUNTER — Other Ambulatory Visit: Payer: Self-pay

## 2024-05-10 ENCOUNTER — Emergency Department (HOSPITAL_COMMUNITY)
Admission: EM | Admit: 2024-05-10 | Discharge: 2024-05-10 | Disposition: A | Discharge status: Home / Self Care | Payer: Self-pay | Attending: Emergency Medicine | Admitting: Emergency Medicine

## 2024-05-10 ENCOUNTER — Encounter (HOSPITAL_COMMUNITY): Payer: Self-pay | Admitting: Emergency Medicine

## 2024-05-10 ENCOUNTER — Emergency Department (HOSPITAL_COMMUNITY): Payer: Self-pay

## 2024-05-10 DIAGNOSIS — T40601A Poisoning by unspecified narcotics, accidental (unintentional), initial encounter: Secondary | ICD-10-CM | POA: Insufficient documentation

## 2024-05-10 DIAGNOSIS — D72829 Elevated white blood cell count, unspecified: Secondary | ICD-10-CM | POA: Insufficient documentation

## 2024-05-10 DIAGNOSIS — L03114 Cellulitis of left upper limb: Secondary | ICD-10-CM | POA: Insufficient documentation

## 2024-05-10 LAB — CBC WITH DIFFERENTIAL/PLATELET
Abs Immature Granulocytes: 0.04 K/uL (ref 0.00–0.07)
Basophils Absolute: 0.1 K/uL (ref 0.0–0.1)
Basophils Relative: 1 %
Eosinophils Absolute: 0.4 K/uL (ref 0.0–0.5)
Eosinophils Relative: 4 %
HCT: 34.6 % — ABNORMAL LOW (ref 39.0–52.0)
Hemoglobin: 11.2 g/dL — ABNORMAL LOW (ref 13.0–17.0)
Immature Granulocytes: 0 %
Lymphocytes Relative: 29 %
Lymphs Abs: 3.1 K/uL (ref 0.7–4.0)
MCH: 27.7 pg (ref 26.0–34.0)
MCHC: 32.4 g/dL (ref 30.0–36.0)
MCV: 85.4 fL (ref 80.0–100.0)
Monocytes Absolute: 0.8 K/uL (ref 0.1–1.0)
Monocytes Relative: 7 %
Neutro Abs: 6.4 K/uL (ref 1.7–7.7)
Neutrophils Relative %: 59 %
Platelets: 294 K/uL (ref 150–400)
RBC: 4.05 MIL/uL — ABNORMAL LOW (ref 4.22–5.81)
RDW: 13.6 % (ref 11.5–15.5)
WBC: 10.7 K/uL — ABNORMAL HIGH (ref 4.0–10.5)
nRBC: 0 % (ref 0.0–0.2)

## 2024-05-10 LAB — COMPREHENSIVE METABOLIC PANEL WITH GFR
ALT: 8 U/L (ref 0–44)
AST: 12 U/L — ABNORMAL LOW (ref 15–41)
Albumin: 3.1 g/dL — ABNORMAL LOW (ref 3.5–5.0)
Alkaline Phosphatase: 83 U/L (ref 38–126)
Anion gap: 10 (ref 5–15)
BUN: 9 mg/dL (ref 6–20)
CO2: 23 mmol/L (ref 22–32)
Calcium: 8.4 mg/dL — ABNORMAL LOW (ref 8.9–10.3)
Chloride: 103 mmol/L (ref 98–111)
Creatinine, Ser: 0.85 mg/dL (ref 0.61–1.24)
GFR, Estimated: >60 mL/min (ref >60)
Glucose, Bld: 99 mg/dL (ref 70–99)
Potassium: 3.7 mmol/L (ref 3.5–5.1)
Sodium: 136 mmol/L (ref 135–145)
Total Bilirubin: 0.5 mg/dL (ref 0.0–1.2)
Total Protein: 6.7 g/dL (ref 6.5–8.1)

## 2024-05-10 LAB — LACTIC ACID, PLASMA
Lactic Acid, Venous: 1.1 mmol/L (ref 0.5–1.9)
Lactic Acid, Venous: 1.2 mmol/L (ref 0.5–1.9)

## 2024-05-10 MED ORDER — IOHEXOL 300 MG/ML  SOLN
75.0000 mL | Freq: Once | INTRAMUSCULAR | Status: AC | PRN
  Administered 2024-05-10: 75 mL via INTRAVENOUS

## 2024-05-10 MED ORDER — DOXYCYCLINE HYCLATE 100 MG PO TABS
100.0000 mg | ORAL_TABLET | Freq: Once | ORAL | Status: AC
  Administered 2024-05-10: 100 mg via ORAL
  Filled 2024-05-10: qty 1

## 2024-05-10 MED ORDER — DOXYCYCLINE HYCLATE 100 MG PO CAPS: 100.0000 mg | ORAL_CAPSULE | Freq: Two times a day (BID) | ORAL | 0 refills | Status: AC

## 2024-05-10 MED ORDER — TETANUS-DIPHTH-ACELL PERTUSSIS 5-2.5-18.5 LF-MCG/0.5 IM SUSY
0.5000 mL | PREFILLED_SYRINGE | Freq: Once | INTRAMUSCULAR | Status: DC
  Filled 2024-05-10: qty 0.5

## 2024-05-10 MED ORDER — NALOXONE HCL 4 MG/0.1ML NA LIQD: NASAL | 1 refills | Status: AC

## 2024-05-10 NOTE — ED Notes (Signed)
 When asked what brought pt in, pt unable to recall events. States he guesses he was using fentanyl and went out. Denies trying to harm self.

## 2024-05-10 NOTE — ED Provider Notes (Signed)
 [place] EMERGENCY DEPARTMENT AT [facility] Provider Note   CSN: [id number] Arrival date & time: 05/10/24  1034     Patient presents with: Drug Overdose   Taylor Carpenter is a 33 y.o. male.  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  Inadvertently overdosed today, was able to flagged down EMS who gave Narcan  without noticed decreased mental status and agonal breathing, patient breathing spontaneously now, states he is concerned about left hand and wrist pain and swelling that started yesterday after injecting into the low left lateral wrist.  He states he aims for IV injection but sometimes ends up missing the vein.  Denies fever or chills, denies numbness or tingling.  No limited range of motion.  No chest pain or shortness of breath.  {Add pertinent medical, surgical, social history, OB history to YEP:67052}  Drug Overdose       Prior to Admission medications   Medication Sig Start Date End Date Taking? Authorizing Provider  albuterol (VENTOLIN HFA) 108 (90 Base) MCG/ACT inhaler Inhale 1-2 puffs into the lungs every 6 (six) hours as needed for wheezing or shortness of breath.    [provider]  bacitracin  ointment Apply 1 application topically 2 (two) times daily. Patient not taking: Reported on 04/12/2019 04/14/18   Carita Senior, MD  clindamycin  (CLEOCIN ) 300 MG capsule Take 1 capsule (300 mg total) by mouth 3 (three) times daily. Patient not taking: Reported on 04/12/2019 04/14/18   Carita Senior, MD  cyclobenzaprine  (FLEXERIL ) 10 MG tablet Take 1 tablet (10 mg total) by mouth 3 (three) times daily. Patient not taking: Reported on 04/12/2019 04/02/19   Armida Culver, PA-C  diclofenac  (VOLTAREN ) 75 MG EC tablet Take 1 tablet (75 mg total) by mouth 2 (two) times daily. 04/12/19   Idol, Julie, PA-C  HYDROcodone -acetaminophen  (NORCO/VICODIN) 5-325 MG per tablet Take one-two tabs po q 4-6 hrs prn pain Patient not taking: Reported on 04/12/2019 08/25/13    Herlinda Milling, PA-C  meloxicam  (MOBIC ) 7.5 MG tablet Take 1 tablet (7.5 mg total) by mouth daily. 08/10/19   Caccavale, Sophia, PA-C  methocarbamol  (ROBAXIN ) 500 MG tablet Take 1 tablet (500 mg total) by mouth 4 (four) times daily. 04/12/19   Idol, Julie, PA-C    Allergies: Amoxil [amoxicillin]    Review of Systems  Updated Vital Signs BP 104/64   Pulse 67   Temp 98.2 F (36.8 C) (Oral)   Resp 11   Ht 5' 8 (1.727 m)   Wt 65.8 kg   SpO2 100%   BMI 22.05 kg/m   Physical Exam Vitals and nursing note reviewed.  Constitutional:      General: He is not in acute distress.    Appearance: He is well-developed.  HENT:     Head: Normocephalic and atraumatic.  Eyes:     Conjunctiva/sclera: Conjunctivae normal.  Cardiovascular:     Rate and Rhythm: Normal rate and regular rhythm.     Heart sounds: No murmur heard. Pulmonary:     Effort: Pulmonary effort is normal. No respiratory distress.     Breath sounds: Normal breath sounds.  Abdominal:     Palpations: Abdomen is soft.     Tenderness: There is no abdominal tenderness.  Musculoskeletal:        General: No swelling.     Cervical back: Neck supple.  Skin:    General: Skin is warm and dry.     Capillary Refill: Capillary refill takes less than 2 seconds.  Neurological:  General: No focal deficit present.     Mental Status: He is alert and oriented to person, place, and time.  Psychiatric:        Mood and Affect: Mood normal.     (all labs ordered are listed, but only abnormal results are displayed) Labs Reviewed  CULTURE, BLOOD (ROUTINE X 2)  CULTURE, BLOOD (ROUTINE X 2)  CBC WITH DIFFERENTIAL/PLATELET  COMPREHENSIVE METABOLIC PANEL WITH GFR  LACTIC ACID, PLASMA  LACTIC ACID, PLASMA    EKG: EKG Interpretation Date/Time:  Friday May 10 2024 10:57:01 EDT Ventricular Rate:  66 PR Interval:  132 QRS Duration:  95 QT Interval:  404 QTC Calculation: 424 R Axis:   119  Text Interpretation: Sinus rhythm  Right axis deviation rate slower than prior 9/18 Confirmed by Towana Sharper [phone]) on 05/10/2024 11:15:10 AM  Radiology: No results found.  {Document cardiac monitor, telemetry assessment procedure when appropriate:32947} Procedures   Medications Ordered in the ED - No data to display    {Click here for ABCD2, HEART and other calculators REFRESH Note before signing:1}                              Medical Decision Making Amount and/or Complexity of Data Reviewed Labs: ordered. Radiology: ordered.   ***  {Document critical care time when appropriate  Document review of labs and clinical decision tools ie CHADS2VASC2, etc  Document your independent review of radiology images and any outside records  Document your discussion with family members, caretakers and with consultants  Document social determinants of health affecting pt's care  Document your decision making why or why not admission, treatments were needed:32947:::1}   Final diagnoses:  None    ED Discharge Orders     None

## 2024-05-10 NOTE — ED Triage Notes (Addendum)
 Pt escorted to ED by [employer] for accidental overdose. Pt states he took fentanyl. Pts mom flagged down EMS whom noted pt to have agonal breathing. Pt was given one dose of narcan . Pt is in no distress in triage. Denies HI or SI. Pts left hand also noted to be swollen and tender from IV drug use.

## 2024-05-10 NOTE — Discharge Instructions (Addendum)
 Is a pleasure taking care of you today.  You were seen in the ER for evaluation after unintentional overdose of opiates.  You were given Narcan  by EMS and are back to your baseline.  Please follow-up with recovery services with assistance and stopping your opiate use.  You were given Narcan  that can be used in case of accidental overdose in the future.  You also have an infection of your left hand.  Your CT scan did not show any sign of deep abscess so we are treating you with antibiotics.  You should have a recheck in approximately 2 days but if you have increased redness, swelling or pain you need to come back to the ER right away.  Please follow-up for recheck with the orthopedic doctor listed.  [facility] Primary Care Doctor List    Rollene Pesa, MD. Specialty: [facility] Medicine Contact information: [address]  [place] [address]  [phone]   Glendia Fielding, MD. Specialty: [facility] Medicine Contact information: [address]  [place] [address]  [phone]   Benita Outhouse, MD Specialty: Internal Medicine Contact information: [address] [place] [address]  [phone]   Darlyn Hurst, MD. Specialty: Internal Medicine Contact information: [address]  [place] [address]  [phone]    [facility] (Dr. Luke) Specialty: Family Medicine Contact information: [address]  [place] [address]  [phone]   Garnette Lolling, MD. Specialty: [facility] Medicine Contact information: [address]  [address]  [place] [address]  [phone]   Gaither Langton, MD. Specialty: Internal Medicine Contact information: [address]  [address]  [place] [address]  [phone]    [facility]  [address] [place], [place] [address] [phone]  Services [facility] offers a variety of basic health services.  Services include but are  not limited to: Blood pressure checks  Heart rate checks  Blood sugar checks  Urine analysis  Rapid strep tests  Pregnancy tests.  Health education and referrals  People needing more complex services will be directed to a physician online. Using these virtual visits, doctors can evaluate and prescribe medicine and treatments. There will be no medication on-site, though [facility] will help patients fill their prescriptions at little to no cost.   For More information please go to: [email or web address]

## 2024-05-15 LAB — CULTURE, BLOOD (ROUTINE X 2)
Culture: NO GROWTH
Culture: NO GROWTH
Special Requests: ADEQUATE
